# Patient Record
Sex: Female | Born: 1980 | Race: Black or African American | Hispanic: No | Smoking: Never smoker
Health system: Southern US, Community
[De-identification: ages and names within clinical notes are randomized; demographics above are authoritative.]

## PROBLEM LIST (undated history)

## (undated) DIAGNOSIS — R0789 Other chest pain: Secondary | ICD-10-CM

## (undated) DIAGNOSIS — G4700 Insomnia, unspecified: Secondary | ICD-10-CM

## (undated) HISTORY — DX: Other chest pain: R07.89

## (undated) HISTORY — DX: Insomnia, unspecified: G47.00

---

## 2015-03-07 ENCOUNTER — Ambulatory Visit
Admission: RE | Admit: 2015-03-07 | Discharge: 2015-03-07 | Disposition: A | Discharge status: Home / Self Care | Payer: No Typology Code available for payment source | Source: Ambulatory Visit | Attending: Infectious Disease | Admitting: Infectious Disease

## 2015-03-07 ENCOUNTER — Other Ambulatory Visit: Payer: Self-pay | Admitting: Infectious Disease

## 2015-03-07 DIAGNOSIS — R7611 Nonspecific reaction to tuberculin skin test without active tuberculosis: Secondary | ICD-10-CM

## 2015-06-20 ENCOUNTER — Emergency Department (HOSPITAL_COMMUNITY)
Admission: EM | Admit: 2015-06-20 | Discharge: 2015-06-20 | Disposition: A | Discharge status: Home / Self Care | Payer: Managed Care, Other (non HMO) | Attending: Emergency Medicine | Admitting: Emergency Medicine

## 2015-06-20 ENCOUNTER — Encounter (HOSPITAL_COMMUNITY): Payer: Self-pay

## 2015-06-20 DIAGNOSIS — M546 Pain in thoracic spine: Secondary | ICD-10-CM

## 2015-06-20 DIAGNOSIS — S3992XA Unspecified injury of lower back, initial encounter: Secondary | ICD-10-CM | POA: Diagnosis present

## 2015-06-20 DIAGNOSIS — S29002A Unspecified injury of muscle and tendon of back wall of thorax, initial encounter: Secondary | ICD-10-CM | POA: Diagnosis not present

## 2015-06-20 DIAGNOSIS — Y9389 Activity, other specified: Secondary | ICD-10-CM | POA: Diagnosis not present

## 2015-06-20 DIAGNOSIS — M545 Low back pain: Secondary | ICD-10-CM

## 2015-06-20 DIAGNOSIS — Y92410 Unspecified street and highway as the place of occurrence of the external cause: Secondary | ICD-10-CM | POA: Insufficient documentation

## 2015-06-20 DIAGNOSIS — Y998 Other external cause status: Secondary | ICD-10-CM | POA: Diagnosis not present

## 2015-06-20 MED ORDER — CYCLOBENZAPRINE HCL 10 MG PO TABS: 10.0000 mg | ORAL_TABLET | Freq: Three times a day (TID) | ORAL | Status: DC | PRN

## 2015-06-20 MED ORDER — ACETAMINOPHEN 500 MG PO TABS
1000.0000 mg | ORAL_TABLET | Freq: Once | ORAL | Status: AC
  Administered 2015-06-20: 1000 mg via ORAL
  Filled 2015-06-20: qty 2

## 2015-06-20 MED ORDER — IBUPROFEN 400 MG PO TABS
600.0000 mg | ORAL_TABLET | Freq: Once | ORAL | Status: AC
  Administered 2015-06-20: 600 mg via ORAL
  Filled 2015-06-20: qty 1

## 2015-06-20 MED ORDER — IBUPROFEN 600 MG PO TABS: 600.0000 mg | ORAL_TABLET | Freq: Three times a day (TID) | ORAL | Status: DC | PRN

## 2015-06-20 NOTE — Discharge Instructions (Signed)
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
## 2016-12-04 ENCOUNTER — Encounter: Payer: Self-pay | Admitting: Internal Medicine

## 2016-12-04 ENCOUNTER — Ambulatory Visit (INDEPENDENT_AMBULATORY_CARE_PROVIDER_SITE_OTHER): Payer: Managed Care, Other (non HMO) | Admitting: Internal Medicine

## 2016-12-04 ENCOUNTER — Encounter (INDEPENDENT_AMBULATORY_CARE_PROVIDER_SITE_OTHER): Payer: Self-pay

## 2016-12-04 ENCOUNTER — Other Ambulatory Visit: Payer: Self-pay

## 2016-12-04 DIAGNOSIS — Z0189 Encounter for other specified special examinations: Secondary | ICD-10-CM

## 2016-12-04 DIAGNOSIS — R0789 Other chest pain: Secondary | ICD-10-CM

## 2016-12-04 DIAGNOSIS — G4700 Insomnia, unspecified: Secondary | ICD-10-CM | POA: Insufficient documentation

## 2016-12-04 DIAGNOSIS — Z0000 Encounter for general adult medical examination without abnormal findings: Secondary | ICD-10-CM

## 2016-12-04 DIAGNOSIS — Z833 Family history of diabetes mellitus: Secondary | ICD-10-CM | POA: Diagnosis not present

## 2016-12-04 HISTORY — DX: Insomnia, unspecified: G47.00

## 2016-12-04 HISTORY — DX: Other chest pain: R07.89

## 2016-12-04 MED ORDER — RAMELTEON 8 MG PO TABS: 8.0000 mg | ORAL_TABLET | Freq: Every evening | ORAL | 1 refills | Status: DC | PRN

## 2016-12-04 NOTE — Patient Instructions (Addendum)
[facility].  I have written a prescription for Ramelteon 8mg  to be taken 30 minutes before bed each day you sleep.  If you have any questions please contact out clinic.  We would like for you to stop at the front desk and establish care with a primary care physician for a return visit. At that time they can discuss if the Ramelteon has been successful or not and make any necessary changes.     Practice Good Sleep Hygiene Here are some suggestions Avoid napping during the day. It can disturb the normal pattern of sleep and wakefulness.  Avoid stimulants such as caffeine, nicotine, and alcohol too close to bedtime. While alcohol is well known to speed the onset of sleep, it disrupts sleep in the second half as the body begins to metabolize the alcohol, causing arousal.  Exercise can promote good sleep. Vigorous exercise should be taken in the morning or late afternoon. A relaxing exercise, like yoga, can be done before bed to help initiate a restful night's sleep. Food can be disruptive right before sleep. Stay away from large meals close to bedtime. Also dietary changes can cause sleep problems, if someone is struggling with a sleep problem, it's not a good time to start experimenting with spicy dishes. And, remember, chocolate has caffeine.  Ensure adequate exposure to natural light. This is particularly important for older people who may not venture outside as frequently as children and adults. Light exposure helps maintain a healthy sleep-wake cycle.  Establish a regular relaxing bedtime routine. Try to avoid emotionally upsetting conversations and activities before trying to go to sleep. Don't dwell on, or bring your problems to bed.  Associate your bed with sleep. It's not a good idea to use your bed to watch TV, listen to the radio, or read.  Make sure that the sleep environment is pleasant and relaxing. The bed should be comfortable, the room should not be  too hot or cold, or too bright.

## 2016-12-04 NOTE — Progress Notes (Addendum)
   CC: "I need help sleeping"  HPI:Ms.Rabia Marissa Nestlebidoye is a 36 y.o. female who presents today to establish care with a primary care physician.   Chest discomfort with laughing: She attested to bilateral upper chest pain while walking, worse with extreme laughing, not made worse with exercise, does not occur while running, does not radiate, no associated diaphoresis, no nausea or vomiting, no headache or visual changes, denied numbness or tingling in hands or arms, not made worse with deep breathing. Denied associated cough, URI, or illness. Pain is described as sharp, 2 seconds in length, rapidly dissipates without intervention.   Insomnia: Assessment: Works night shifts, sleeps during day 4-5 hours. Sleeps during the day and at night while off shift.   Plan: Ramelteon 8mg  QHS 30 minutes prior Return in 3-4 weeks for PCP visit and check on medication effectiveness.     No past medical history on file. Review of Systems:  ROS negative except as per HPI.  PMHx: -Insomnia- -Musculoskeletal chest pain-see HPI   Surgical Hx: -Caesarian section in 2005, 2007, 2012   Family Hx: -Son 6679yrs old-diabetes and HTN -Parents deceased, mother [phone], father [phone]  Social Hx: -Tobacco, denied -EtOH, Denied -Illicit drugs  -Lives at home Husband with three kids.  Physical Exam: Vitals:   12/04/16 1446  BP: 129/72  Pulse: 83  Temp: 98.5 F (36.9 C)  TempSrc: Oral  SpO2: 100%  Weight: 173 lb 3.2 oz (78.6 kg)  Height: 5\' 8"  (1.727 m)   Physical Exam  Constitutional: She is oriented to person, place, and time. No distress.  Eyes: Conjunctivae and EOM are normal.  Neck: Neck supple.  Cardiovascular: Normal rate and regular rhythm.  No murmur heard. Pulmonary/Chest: Effort normal and breath sounds normal. No respiratory distress.  Abdominal: Soft. Bowel sounds are normal. She exhibits no distension. There is no tenderness.  Musculoskeletal: She exhibits no edema or tenderness.    Neurological: She is alert and oriented to person, place, and time.  Skin: Skin is warm. Capillary refill takes less than 2 seconds. She is not diaphoretic.  Psychiatric: She has a normal mood and affect.  Vitals reviewed.   Assessment & Plan:   See Encounters Tab for problem based charting.  Patient seen with Dr. Cleda DaubE. Hoffman

## 2016-12-04 NOTE — Assessment & Plan Note (Signed)
Assessment: Patient had flu vaccine with her employer  Patient deferred pap smear until next visit.   Plan: Please assess if patient is amenable for a pap smear at her next visit.

## 2016-12-04 NOTE — Assessment & Plan Note (Signed)
Chest discomfort with laughing: She attested to bilateral upper chest pain while walking, worse with extreme laughing, not made worse with exercise, does not occur while running, does not radiate, no associated diaphoresis, no nausea or vomiting, no headache or visual changes, denied numbness or tingling in hands or arms, not made worse with deep breathing. Denied associated cough, URI, or illness. Pain is described as sharp, 2 seconds in length, rapidly dissipates without intervention.   Plan: Observe. If pain worsens or changes in nature she was noted to advise us of this.

## 2016-12-04 NOTE — Assessment & Plan Note (Signed)
Insomnia: Assessment: Works night shifts, sleeps during day 4-5 hours. Sleeps during the day and at night while off shift.   Plan: Ramelteon 8mg  QHS 30 minutes prior Return in 3-4 weeks for PCP visit and check on medication effectiveness.  Given information regarding proper sleep hygiene as well.

## 2016-12-05 NOTE — Progress Notes (Signed)
Internal Medicine Clinic Attending  I saw and evaluated the patient.  I personally confirmed the key portions of the history and exam documented by Dr. Harbrecht and I reviewed pertinent patient test results.  The assessment, diagnosis, and plan were formulated together and I agree with the documentation in the resident's note.  

## 2017-01-25 ENCOUNTER — Ambulatory Visit (INDEPENDENT_AMBULATORY_CARE_PROVIDER_SITE_OTHER): Payer: Managed Care, Other (non HMO) | Admitting: Internal Medicine

## 2017-01-25 ENCOUNTER — Ambulatory Visit (HOSPITAL_COMMUNITY)
Admission: RE | Admit: 2017-01-25 | Discharge: 2017-01-25 | Disposition: A | Discharge status: Home / Self Care | Payer: Managed Care, Other (non HMO) | Source: Ambulatory Visit | Attending: Student in an Organized Health Care Education/Training Program | Admitting: Student in an Organized Health Care Education/Training Program

## 2017-01-25 ENCOUNTER — Encounter: Payer: Self-pay | Admitting: Internal Medicine

## 2017-01-25 ENCOUNTER — Other Ambulatory Visit: Payer: Self-pay

## 2017-01-25 DIAGNOSIS — J069 Acute upper respiratory infection, unspecified: Secondary | ICD-10-CM | POA: Diagnosis present

## 2017-01-25 DIAGNOSIS — J40 Bronchitis, not specified as acute or chronic: Secondary | ICD-10-CM | POA: Diagnosis not present

## 2017-01-25 DIAGNOSIS — R05 Cough: Secondary | ICD-10-CM | POA: Diagnosis not present

## 2017-01-25 DIAGNOSIS — B9789 Other viral agents as the cause of diseases classified elsewhere: Principal | ICD-10-CM

## 2017-01-25 DIAGNOSIS — R7611 Nonspecific reaction to tuberculin skin test without active tuberculosis: Secondary | ICD-10-CM

## 2017-01-25 HISTORY — DX: Acute upper respiratory infection, unspecified: J06.9

## 2017-01-25 NOTE — Assessment & Plan Note (Signed)
Teresa Barnett reports 1 week history of sore throat, cough productive of clear sputum, subjective fevers (normal temps at home), hoarseness of voice (now improving), and malaise. Symptoms began gradually and are somewhat improving. She denies any myalgias, diaphoresis, hemoptysis, shortness of breath, chest pain, congestion, rhinorrhea, or reflux.   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 She had a CXR on 03/07/2015 performed for "Positive TB skin test 1 week ago" which was negative for suspicious lesions. She says she has no known exposures to active TB and has not been treated for latent TB.   A/P: Her symptoms are consistent with viral URI which is slowly improving. 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 I have personally reviewed the CXR films and lungs are clear without evidence of consolidation, effusion, or suspicious lesions; official radiology report pending. I am not concerned about active TB at this time. Can consider treatment for latent TB if patient agreeable. - Continue supportive care

## 2017-01-25 NOTE — Patient Instructions (Addendum)
It was a pleasure to see you Teresa Barnett.  It sounds like you have a viral upper respiratory infection.  Please continue Tylenol or Ibuprofen as needed. You can try over the counter cough medication as needed. Try cough drops, hot tea with honey, ice cubes/popsicles to soothe the throat.  We will check baseline blood work and obtain a CXR as well.  Please schedule a follow up appointment as we discussed or see us sooner if needed.   Viral Respiratory Infection A respiratory infection is an illness that affects part of the respiratory system, such as the lungs, nose, or throat. Most respiratory infections are caused by either viruses or bacteria. A respiratory infection that is caused by a virus is called a viral respiratory infection. Common types of viral respiratory infections include:  A cold.  The flu (influenza).  A respiratory syncytial virus (RSV) infection.  How do I know if I have a viral respiratory infection? Most viral respiratory infections cause:  A stuffy or runny nose.  Yellow or green nasal discharge.  A cough.  Sneezing.  Fatigue.  Achy muscles.  A sore throat.  Sweating or chills.  A fever.  A headache.  How are viral respiratory infections treated? If influenza is diagnosed early, it may be treated with an antiviral medicine that shortens the length of time a person has symptoms. Symptoms of viral respiratory infections may be treated with over-the-counter and prescription medicines, such as:  Expectorants. These make it easier to cough up mucus.  Decongestant nasal sprays.  Health care providers do not prescribe antibiotic medicines for viral infections. This is because antibiotics are designed to kill bacteria. They have no effect on viruses. How do I know if I should stay home from work or school? To avoid exposing others to your respiratory infection, stay home if you have:  A fever.  A persistent cough.  A sore throat.  A runny  nose.  Sneezing.  Muscles aches.  Headaches.  Fatigue.  Weakness.  Chills.  Sweating.  Nausea.  Follow these instructions at home:  Rest as much as possible.  Take over-the-counter and prescription medicines only as told by your health care provider.  Drink enough fluid to keep your urine clear or pale yellow. This helps prevent dehydration and helps loosen up mucus.  Gargle with a salt-water mixture 3-4 times per day or as needed. To make a salt-water mixture, completely dissolve -1 tsp of salt in 1 cup of warm water.  Use nose drops made from salt water to ease congestion and soften raw skin around your nose.  Do not drink alcohol.  Do not use tobacco products, including cigarettes, chewing tobacco, and e-cigarettes. If you need help quitting, ask your health care provider. Contact a health care provider if:  Your symptoms last for 10 days or longer.  Your symptoms get worse over time.  You have a fever.  You have severe sinus pain in your face or forehead.  The glands in your jaw or neck become very swollen. Get help right away if:  You feel pain or pressure in your chest.  You have shortness of breath.  You faint or feel like you will faint.  You have severe and persistent vomiting.  You feel confused or disoriented. This information is not intended to replace advice given to you by your health care provider. Make sure you discuss any questions you have with your health care provider. Document Released: 10/11/2004 Document Revised: 06/09/2015 Document Reviewed: 06/09/2014  Elsevier Interactive Patient Education  2018 [employer]  

## 2017-01-25 NOTE — Progress Notes (Signed)
Internal Medicine Clinic Attending  Case discussed with Dr. Patel at the time of the visit.  We reviewed the resident's history and exam and pertinent patient test results.  I agree with the assessment, diagnosis, and plan of care documented in the resident's note.  

## 2017-01-25 NOTE — Progress Notes (Signed)
   CC: cough, sore throat  HPI:  TeresaTeresa Barnett is a 37 y.o. female who presents for evaluation of cough and sore throat.  Viral URI with cough Teresa Barnett reports 1 week history of sore throat, cough productive of clear sputum, subjective fevers (normal temps at home), hoarseness of voice (now improving), and malaise. Symptoms began gradually and are somewhat improving. She denies any myalgias, diaphoresis, hemoptysis, shortness of breath, chest pain, congestion, rhinorrhea, or reflux.   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 She had a CXR on 03/07/2015 performed for "Positive TB skin test 1 week ago" which was negative for suspicious lesions. She says she has no known exposures to active TB and has not been treated for latent TB.   A/P: Her symptoms are consistent with viral URI which is slowly improving. 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 I have personally reviewed the CXR films and lungs are clear without evidence of consolidation, effusion, or suspicious lesions; official radiology report pending. I am not concerned about active TB at this time. Can consider treatment for latent TB if patient agreeable. - Continue supportive care    Past Medical History:  Diagnosis Date  . Discomfort of chest wall 12/04/2016  . 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 Negative for diaphoresis.       Subjective fevers, normal temps at home  HENT: Positive for sore throat. Negative for congestion.   Respiratory: Positive for cough. Negative for hemoptysis and shortness of breath.        Clear sputum production  Cardiovascular: Negative for chest pain.  Gastrointestinal: Negative for abdominal pain, heartburn, nausea and  vomiting.  Musculoskeletal: Negative for myalgias.  Skin: Negative for rash.     Physical Exam:  Vitals:   01/25/17 0954  BP: 125/76  Pulse: 90  Temp: 98.3 F (36.8 C)  TempSrc: Oral  SpO2: 100%  Weight: 171 lb 9.6 oz (77.8 kg)  Height: 5\' 8"  (1.727 m)   Physical Exam  Constitutional: She is oriented to person, place, and time. She appears well-developed and well-nourished.  Non-toxic appearance. She does not appear ill. No distress.  HENT:  Head: Normocephalic and atraumatic.  Mouth/Throat: Oropharynx is clear and moist. No oropharyngeal exudate.  Cardiovascular: Normal rate and regular rhythm.  Pulmonary/Chest: Effort normal. No respiratory distress. She has no wheezes. She has no rhonchi. She has no rales.  Lymphadenopathy:    She has no cervical adenopathy.  Neurological: She is alert and oriented to person, place, and time.  Skin: Skin is warm. No rash noted.    Assessment & Plan:   See Encounters Tab for problem based charting.  Patient discussed with Dr. Oswaldo DoneVincent

## 2017-01-26 LAB — CBC WITH DIFFERENTIAL/PLATELET
BASOS: 0 %
Basophils Absolute: 0 10*3/uL (ref 0.0–0.2)
EOS (ABSOLUTE): 0.1 10*3/uL (ref 0.0–0.4)
EOS: 1 %
HEMATOCRIT: 34.4 % (ref 34.0–46.6)
Hemoglobin: 10.8 g/dL — ABNORMAL LOW (ref 11.1–15.9)
Immature Grans (Abs): 0 10*3/uL (ref 0.0–0.1)
Immature Granulocytes: 0 %
LYMPHS ABS: 1.7 10*3/uL (ref 0.7–3.1)
Lymphs: 22 %
MCH: 24.9 pg — AB (ref 26.6–33.0)
MCHC: 31.4 g/dL — ABNORMAL LOW (ref 31.5–35.7)
MCV: 79 fL (ref 79–97)
MONOCYTES: 8 %
Monocytes Absolute: 0.6 10*3/uL (ref 0.1–0.9)
NEUTROS ABS: 5.4 10*3/uL (ref 1.4–7.0)
Neutrophils: 69 %
Platelets: 215 10*3/uL (ref 150–379)
RBC: 4.34 x10E6/uL (ref 3.77–5.28)
RDW: 15.3 % (ref 12.3–15.4)
WBC: 7.8 10*3/uL (ref 3.4–10.8)

## 2017-01-26 LAB — BMP8+ANION GAP
Anion Gap: 16 mmol/L (ref 10.0–18.0)
BUN / CREAT RATIO: 15 (ref 9–23)
BUN: 13 mg/dL (ref 6–20)
CO2: 24 mmol/L (ref 20–29)
CREATININE: 0.86 mg/dL (ref 0.57–1.00)
Calcium: 9.7 mg/dL (ref 8.7–10.2)
Chloride: 103 mmol/L (ref 96–106)
GFR calc non Af Amer: 87 mL/min/{1.73_m2} (ref >59)
GFR, EST AFRICAN AMERICAN: 100 mL/min/{1.73_m2} (ref >59)
Glucose: 80 mg/dL (ref 65–99)
Potassium: 4.3 mmol/L (ref 3.5–5.2)
SODIUM: 143 mmol/L (ref 134–144)

## 2017-02-26 ENCOUNTER — Encounter: Payer: Self-pay | Admitting: General Practice

## 2017-02-26 ENCOUNTER — Ambulatory Visit: Payer: Managed Care, Other (non HMO)

## 2017-09-05 ENCOUNTER — Ambulatory Visit (INDEPENDENT_AMBULATORY_CARE_PROVIDER_SITE_OTHER): Payer: No Typology Code available for payment source | Admitting: Internal Medicine

## 2017-09-05 ENCOUNTER — Encounter: Payer: Self-pay | Admitting: Internal Medicine

## 2017-09-05 ENCOUNTER — Other Ambulatory Visit: Payer: Self-pay

## 2017-09-05 VITALS — BP 130/75 | HR 89 | Temp 99.3°F | Ht 68.0 in | Wt 169.0 lb

## 2017-09-05 DIAGNOSIS — R1031 Right lower quadrant pain: Secondary | ICD-10-CM

## 2017-09-05 DIAGNOSIS — N6312 Unspecified lump in the right breast, upper inner quadrant: Secondary | ICD-10-CM | POA: Diagnosis not present

## 2017-09-05 DIAGNOSIS — R109 Unspecified abdominal pain: Secondary | ICD-10-CM

## 2017-09-05 HISTORY — DX: Unspecified abdominal pain: R10.9

## 2017-09-05 HISTORY — DX: Unspecified lump in the right breast, upper inner quadrant: N63.12

## 2017-09-05 LAB — POCT URINALYSIS DIPSTICK
Bilirubin, UA: NEGATIVE
Blood, UA: NEGATIVE
Glucose, UA: NEGATIVE
Ketones, UA: NEGATIVE
Leukocytes, UA: NEGATIVE
Nitrite, UA: NEGATIVE
Protein, UA: NEGATIVE
Spec Grav, UA: 1.02 (ref 1.010–1.025)
Urobilinogen, UA: 0.2 E.U./dL
pH, UA: 6 (ref 5.0–8.0)

## 2017-09-05 LAB — POCT URINE PREGNANCY: Preg Test, Ur: NEGATIVE

## 2017-09-05 NOTE — Progress Notes (Signed)
CC: Right breast lump, RLQ pain  HPI:  Teresa Barnett is a 37 y.o. woman with no significant past medical history presenting for evaluation of right breast lump and right lower quadrant pain.  See problem based assessment below for further details.  Right breast lump: Teresa Barnett presents with a lump of her right breast with onset 2 months ago.  With regular palpation, she has noticed that the lump has been growing in size but unable to qualify the size.  Denies any breast discharge, skin changes, breast pain, family history of breast, ovarian, GI malignancies.  Also indicates that the lump is not associated with her menses but endorses tenderness with palpation.  She has regular menses with LMP 1 month ago.  Menarche at age 70 and currently has 3 children.  On physical exam, there were no noticeable breast skin changes bilaterally, there was a palpable mass of the right breast located at 2 o'clock measuring 2 to 3 cm that was tender to palpation.  Left breast exam was unremarkable.  Differential diagnosis for this young healthy woman with no significant family history of malignancy presenting with right breast lump include fibroadenoma vs fibrocystic changes of breast.  Ideally, I will only obtain an ultrasound if she was less than 24 years old. However given the range of her age, I will order both an ultrasound and mammogram. -Follow-up ultrasound and mammogram -Further management pending imaging studies  Right sided abdominal pain: Teresa Barnett reports of a 65-month history of right sided abdominal pain more toward the right lower quadrant that is only present when she is walking.  She works as a Engineer, civil (consulting) and requires increase ambulation.  Rates the pain as 4/10, sharp in nature and subsides with rest.  She denies fevers, chills, anorexia, nausea, vomiting.  She is sexually active and not on contraceptive.  LMP was 1 month ago and does not report irregular vaginal bleeding or unusual vaginal  discharge.  The pain is not related to diet.  She has bowel movements every 3 days and states that this is regular for her.  Abdominal exam was completely benign but noticeable pelvic scar from 3 previous C-sections.  Point-of-care urine pregnancy test was negative, point-of-care urine dipstick was unremarkable.  Differential diagnosis for this young healthy woman presented with right lower quadrant pain with exertion include constipation vs endometriosis vs MSK pain vs ovarian cyst vs referred pain from hepatic steatosis.  If pain persist, consideration could be given obtaining transabdominal ultrasound to evaluate for ovarian cyst.  -Continue to monitor. -Follow-up LFT  Past Medical History:  Diagnosis Date  . Discomfort of chest wall 12/04/2016  . 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  HENT:  Head: Normocephalic and atraumatic.  Cardiovascular: Normal rate, regular rhythm and normal heart sounds. Exam reveals no gallop and no friction rub.  No murmur heard. Pulmonary/Chest: Breath sounds normal. No respiratory distress. She has no wheezes. Right breast exhibits tenderness. Right breast exhibits no inverted nipple, no mass, no nipple discharge and no skin change. Left breast exhibits no inverted nipple, no mass, no nipple discharge, no skin change and no tenderness. Breasts  are symmetrical.    Abdominal: Soft. Bowel sounds are normal. She exhibits no distension. There is no tenderness.    Assessment & Plan:   See Encounters Tab for problem based charting.  Patient discussed with Dr. Sandre Kittyaines

## 2017-09-05 NOTE — Patient Instructions (Addendum)
Ms. Teresa Barnett,   It was a pleasure taking care of you at the clinic today.  I am ordering a few labs including pregnancy test, liver test, mammogram and ultrasound.  I will contact you with the results of these labs and give you follow-up recommendations.  ~Dr. Dortha SchwalbeAgyei

## 2017-09-05 NOTE — Assessment & Plan Note (Signed)
Right sided abdominal pain: Ms. Teresa Barnett reports of a 7141-month history of right sided abdominal pain more toward the right lower quadrant that is only present when she is walking.  She works as a Engineer, civil (consulting)nurse and requires increase ambulation.  Rates the pain as 4/10, sharp in nature and subsides with rest.  She denies fevers, chills, anorexia, nausea, vomiting.  She is sexually active and not on contraceptive.  LMP was 1 month ago and does not report irregular vaginal bleeding or unusual vaginal discharge.  The pain is not related to diet.  She has bowel movements every 3 days and states that this is regular for her.  Abdominal exam was completely benign but noticeable pelvic scar from 3 previous C-sections.  Point-of-care urine pregnancy test was negative, point-of-care urine dipstick was unremarkable.  Differential diagnosis for this young healthy woman presented with right lower quadrant pain with exertion include constipation vs endometriosis vs MSK pain vs ovarian cyst vs referred pain from hepatic steatosis.  If pain persist, consideration could be given obtaining transabdominal ultrasound to evaluate for ovarian cyst.  -Continue to monitor. -Follow-up LFT

## 2017-09-05 NOTE — Assessment & Plan Note (Signed)
Right breast lump: Teresa Barnett presents with a lump of her right breast with onset 2 months ago.  With regular palpation, she has noticed that the lump has been growing in size but unable to qualify the size.  Denies any breast discharge, skin changes, breast pain, family history of breast, ovarian, GI malignancies.  Also indicates that the lump is not associated with her menses but endorses tenderness with palpation.  She has regular menses with LMP 1 month ago.  Menarche at age 37 and currently has 3 children.  On physical exam, there were no noticeable breast skin changes bilaterally, there was a palpable mass of the right breast located at 2 o'clock measuring 2 to 3 cm that was tender to palpation.  Left breast exam was unremarkable.  Differential diagnosis for this young healthy woman with no significant family history of malignancy presenting with right breast lump include fibroadenoma vs fibrocystic changes of breast.  Ideally, I will only obtain an ultrasound if she was less than 993 years old. However given the range of her age, I will order both an ultrasound and mammogram. -Follow-up ultrasound and mammogram -Further management pending imaging studies

## 2017-09-06 ENCOUNTER — Telehealth: Payer: Self-pay | Admitting: Internal Medicine

## 2017-09-06 LAB — URINALYSIS, COMPLETE
Bilirubin, UA: NEGATIVE
Glucose, UA: NEGATIVE
Ketones, UA: NEGATIVE
Leukocytes, UA: NEGATIVE
Nitrite, UA: NEGATIVE
Protein, UA: NEGATIVE
RBC, UA: NEGATIVE
Specific Gravity, UA: 1.018 (ref 1.005–1.030)
Urobilinogen, Ur: 0.2 mg/dL (ref 0.2–1.0)
pH, UA: 5.5 (ref 5.0–7.5)

## 2017-09-06 LAB — HEPATIC FUNCTION PANEL
ALT: 13 IU/L (ref 0–32)
AST: 17 IU/L (ref 0–40)
Albumin: 4.3 g/dL (ref 3.5–5.5)
Alkaline Phosphatase: 45 IU/L (ref 39–117)
Bilirubin Total: 0.4 mg/dL (ref 0.0–1.2)
Bilirubin, Direct: 0.13 mg/dL (ref 0.00–0.40)
Total Protein: 7.3 g/dL (ref 6.0–8.5)

## 2017-09-06 LAB — MICROSCOPIC EXAMINATION: Casts: NONE SEEN /lpf

## 2017-09-06 NOTE — Telephone Encounter (Signed)
I called patient to discuss her lab results with her.  She had negative LFT, urinalysis and pregnancy test.  She was glad to hear about the normal findings.

## 2017-09-06 NOTE — Progress Notes (Signed)
Internal Medicine Clinic Attending  I saw and evaluated the patient.  I personally confirmed the key portions of the history and exam documented by Dr. Agyei and I reviewed pertinent patient test results.  The assessment, diagnosis, and plan were formulated together and I agree with the documentation in the resident's note.  Alexander Raines, M.D., Ph.D.  

## 2017-09-10 ENCOUNTER — Other Ambulatory Visit: Payer: No Typology Code available for payment source

## 2017-09-11 ENCOUNTER — Ambulatory Visit
Admission: RE | Admit: 2017-09-11 | Discharge: 2017-09-11 | Disposition: A | Discharge status: Home / Self Care | Payer: No Typology Code available for payment source | Source: Ambulatory Visit | Attending: Internal Medicine | Admitting: Internal Medicine

## 2017-09-11 DIAGNOSIS — N6312 Unspecified lump in the right breast, upper inner quadrant: Secondary | ICD-10-CM

## 2017-09-12 ENCOUNTER — Encounter: Payer: Self-pay | Admitting: Internal Medicine

## 2017-09-12 NOTE — Progress Notes (Signed)
I have sent a letter to the patient regarding her mammogram and ultrasound as I have been unable to reach her.  Overall, she had a negative mammogram and ultrasound and per radiology she is recommended for screening mammogram at age 37.

## 2018-01-29 ENCOUNTER — Encounter: Payer: Self-pay | Admitting: Internal Medicine

## 2018-04-28 ENCOUNTER — Ambulatory Visit (INDEPENDENT_AMBULATORY_CARE_PROVIDER_SITE_OTHER): Payer: No Typology Code available for payment source | Admitting: Internal Medicine

## 2018-04-28 ENCOUNTER — Other Ambulatory Visit: Payer: Self-pay

## 2018-04-28 DIAGNOSIS — G43109 Migraine with aura, not intractable, without status migrainosus: Secondary | ICD-10-CM | POA: Diagnosis not present

## 2018-04-28 MED ORDER — SUMATRIPTAN SUCCINATE 50 MG PO TABS: 50.0000 mg | ORAL_TABLET | ORAL | 0 refills | Status: DC | PRN

## 2018-04-28 NOTE — Progress Notes (Signed)
   [facility] Internal Medicine Residency Telephone Encounter  Reason for call:   This telephone encounter was created for Teresa Barnett on 04/28/2018 for the following purpose/cc migraine.   Pertinent Data:   History of insomnia otherwise no real PMH.  No high blood pressure. Not on any meds.   ROS: Pulmonary: pt denies increased work of breathing, shortness of breath,  Cardiac: pt denies palpitations, chest pain,   Abdominal: pt denies abdominal pain, nausea, vomiting, or diarrhea   Assessment / Plan / Recommendations:   A: Migraine: started on Tuesday of last week then went away Friday.  Headache is left sided. Had an aura and photofobia. Not on birth control.  No changes in schedule works nights.  No meningismus.  Gradual onset with aura preceding.  No neurological deficits normal strength and sensation, coordination.  No change in smile, no head injuries. Hasn't had headaches like this since about 4 months ago.  Gets them periodically but usually they respond well to tylenol.  Tylenol helped a little this time but headache persisted. No alarm symptoms, headaches classic for migraine.       P: prescribe sumatriptan for abortive therapy, she will also try naproxen instead of tylenol.    As always, pt is advised that if symptoms worsen or new symptoms arise, they should go to an urgent care facility or to to ER for further evaluation.   Consent and Medical Decision Making:   Patient discussed with Dr. Cyndie Chime  This is a telephone encounter between Carnelia Ademidun Febles and Thornell Mule on 04/28/2018 for . The visit was conducted with the patient located at home and Thornell Mule at [facility]. The patient's identity was confirmed using their DOB and current address. The patient has consented to being evaluated through a telephone encounter and understands the associated risks (an examination cannot be done and the patient may need to come in for an appointment) /  benefits (allows the patient to remain at home, decreasing exposure to coronavirus). I personally spent 18 minutes on medical discussion.

## 2018-04-28 NOTE — Progress Notes (Signed)
Medicine attending: Medical history, presenting problems,  and medications, reviewed with resident physician Dr Chana Bode on the day of the patient telephone conversation and I concur with his evaluation and management plan. Neurology RN c/o signs & sxs typical of migraine occurring about every 4 months. I concur w trial of sumatriptan at onset of aura & NSAIDs to otherwise control sxs.

## 2018-04-28 NOTE — Assessment & Plan Note (Signed)
   A: Migraine: started on Tuesday of last week then went away Friday.  Headache is left sided. Had an aura and photofobia. Not on birth control.  No changes in schedule works nights.  No meningismus.  Gradual onset with aura preceding.  No neurological deficits normal strength and sensation, coordination.  No change in smile, no head injuries. Hasn't had headaches like this since about 4 months ago.  Gets them periodically but usually they respond well to tylenol.  Tylenol helped a little this time but headache persisted. No alarm symptoms, headaches classic for migraine.       P: prescribe sumatriptan for abortive therapy, she will also try naproxen instead of tylenol.    As always, pt is advised that if symptoms worsen or new symptoms arise, they should go to an urgent care facility or to to ER for further evaluation.

## 2018-12-03 ENCOUNTER — Other Ambulatory Visit: Payer: Self-pay

## 2018-12-03 ENCOUNTER — Encounter: Payer: Self-pay | Admitting: Internal Medicine

## 2018-12-03 ENCOUNTER — Ambulatory Visit (INDEPENDENT_AMBULATORY_CARE_PROVIDER_SITE_OTHER): Payer: No Typology Code available for payment source | Admitting: Internal Medicine

## 2018-12-03 VITALS — BP 117/75 | HR 108 | Temp 98.8°F | Ht 68.0 in | Wt 173.3 lb

## 2018-12-03 DIAGNOSIS — Z124 Encounter for screening for malignant neoplasm of cervix: Secondary | ICD-10-CM

## 2018-12-03 DIAGNOSIS — Z0000 Encounter for general adult medical examination without abnormal findings: Secondary | ICD-10-CM

## 2018-12-03 DIAGNOSIS — Z114 Encounter for screening for human immunodeficiency virus [HIV]: Secondary | ICD-10-CM | POA: Diagnosis not present

## 2018-12-03 DIAGNOSIS — G8911 Acute pain due to trauma: Secondary | ICD-10-CM | POA: Diagnosis not present

## 2018-12-03 DIAGNOSIS — M542 Cervicalgia: Secondary | ICD-10-CM | POA: Diagnosis not present

## 2018-12-03 DIAGNOSIS — R000 Tachycardia, unspecified: Secondary | ICD-10-CM | POA: Insufficient documentation

## 2018-12-03 DIAGNOSIS — M7918 Myalgia, other site: Secondary | ICD-10-CM | POA: Diagnosis not present

## 2018-12-03 DIAGNOSIS — G43109 Migraine with aura, not intractable, without status migrainosus: Secondary | ICD-10-CM | POA: Diagnosis not present

## 2018-12-03 NOTE — Assessment & Plan Note (Signed)
Need for cervical cancer screening: Patient has elected to schedule her cervical cancer screening for December some time. She did not wish to have this performed today.  I advised her that she had this obtained by OB/GYN or through our office if she prefers.

## 2018-12-03 NOTE — Assessment & Plan Note (Signed)
Tachycardia: Appears sinus. Confirmed with auscultation. No MRG's. Patient appears comfortable at rest and lacking overt anxiety. Given her prior anemia of 10.8 in January 2019 I will order screening labs to better evaluate her tachycardia but given that she is asymptomatic we will await these results prior to pursuing additional workup.  Plan:  TSH BMP CBC w/ diff
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
## 2018-12-03 NOTE — Progress Notes (Signed)
Internal Medicine Clinic Attending  Case discussed with Dr. Harbrecht at the time of the visit.  We reviewed the resident's history and exam and pertinent patient test results.  I agree with the assessment, diagnosis, and plan of care documented in the resident's note.   

## 2018-12-03 NOTE — Assessment & Plan Note (Signed)
Need for HIV screening: Patient agreed to testing today for her routine HIV screening.

## 2018-12-03 NOTE — Assessment & Plan Note (Signed)
Migraines: Better with aleve and rest. Very intermittent. Less than 1-2 per month. Continue to monitor for now and use NSAIDS/rest as needed.

## 2018-12-03 NOTE — Assessment & Plan Note (Signed)
  Need for Influenza vaccination: Patient has received her influenza vaccination at work. She is a Adult nurse.

## 2018-12-03 NOTE — Patient Instructions (Addendum)
Please stop at the front desk and schedule a pap smear only visit in December.   FOLLOW-UP INSTRUCTIONS When: A year after completion of the Pap smear For: Routine visit What to bring: All of your medications  Today we discussed your need for routine screening of renal function, electrolytes, hemoglobin, and routine HIV screening. Additionally, given you regular heart rate at an increased rate we will obtain a TSH. I will notify you of the results of any labs from today's evaluation when available to me and make them available on MyChart.   Thank you for your visit to the [facility] today. If you have any questions or concerns please call us at [phone].

## 2018-12-04 LAB — TSH: TSH: 0.748 u[IU]/mL (ref 0.450–4.500)

## 2018-12-04 LAB — CBC WITH DIFFERENTIAL/PLATELET
Basophils Absolute: 0 10*3/uL (ref 0.0–0.2)
Basos: 1 %
EOS (ABSOLUTE): 0.1 10*3/uL (ref 0.0–0.4)
Eos: 1 %
Hematocrit: 34.9 % (ref 34.0–46.6)
Hemoglobin: 10.9 g/dL — ABNORMAL LOW (ref 11.1–15.9)
Immature Grans (Abs): 0 10*3/uL (ref 0.0–0.1)
Immature Granulocytes: 0 %
Lymphocytes Absolute: 1.6 10*3/uL (ref 0.7–3.1)
Lymphs: 26 %
MCH: 24.9 pg — ABNORMAL LOW (ref 26.6–33.0)
MCHC: 31.2 g/dL — ABNORMAL LOW (ref 31.5–35.7)
MCV: 80 fL (ref 79–97)
Monocytes Absolute: 0.4 10*3/uL (ref 0.1–0.9)
Monocytes: 6 %
Neutrophils Absolute: 4.1 10*3/uL (ref 1.4–7.0)
Neutrophils: 66 %
Platelets: 188 10*3/uL (ref 150–450)
RBC: 4.38 x10E6/uL (ref 3.77–5.28)
RDW: 13.9 % (ref 11.7–15.4)
WBC: 6.1 10*3/uL (ref 3.4–10.8)

## 2018-12-04 LAB — BMP8+ANION GAP
Anion Gap: 16 mmol/L (ref 10.0–18.0)
BUN/Creatinine Ratio: 14 (ref 9–23)
BUN: 12 mg/dL (ref 6–20)
CO2: 22 mmol/L (ref 20–29)
Calcium: 9.6 mg/dL (ref 8.7–10.2)
Chloride: 103 mmol/L (ref 96–106)
Creatinine, Ser: 0.83 mg/dL (ref 0.57–1.00)
GFR calc Af Amer: 103 mL/min/{1.73_m2} (ref >59)
GFR calc non Af Amer: 89 mL/min/{1.73_m2} (ref >59)
Glucose: 93 mg/dL (ref 65–99)
Potassium: 4 mmol/L (ref 3.5–5.2)
Sodium: 141 mmol/L (ref 134–144)

## 2018-12-04 LAB — LIPID PANEL
Chol/HDL Ratio: 3.1 ratio (ref 0.0–4.4)
Cholesterol, Total: 147 mg/dL (ref 100–199)
HDL: 48 mg/dL (ref >39)
LDL Chol Calc (NIH): 81 mg/dL (ref 0–99)
Triglycerides: 94 mg/dL (ref 0–149)
VLDL Cholesterol Cal: 18 mg/dL (ref 5–40)

## 2018-12-04 LAB — HIV ANTIBODY (ROUTINE TESTING W REFLEX): HIV Screen 4th Generation wRfx: NONREACTIVE

## 2018-12-17 ENCOUNTER — Encounter: Payer: Self-pay | Admitting: Internal Medicine

## 2018-12-17 ENCOUNTER — Other Ambulatory Visit (HOSPITAL_COMMUNITY)
Admission: RE | Admit: 2018-12-17 | Discharge: 2018-12-17 | Disposition: A | Discharge status: Home / Self Care | Payer: No Typology Code available for payment source | Source: Ambulatory Visit | Attending: Internal Medicine | Admitting: Internal Medicine

## 2018-12-17 ENCOUNTER — Ambulatory Visit: Payer: No Typology Code available for payment source | Admitting: Internal Medicine

## 2018-12-17 VITALS — BP 125/77 | HR 78 | Temp 98.9°F | Ht 68.0 in | Wt 171.9 lb

## 2018-12-17 DIAGNOSIS — Z124 Encounter for screening for malignant neoplasm of cervix: Secondary | ICD-10-CM | POA: Insufficient documentation

## 2018-12-17 NOTE — Progress Notes (Signed)
PAP smear performed today. Chaperoned by Sander Nephew. The cervical os was visualized without comlication on the first attempt. It was closed, there was not discharge or erythema noted. Results will be called to the patient.

## 2018-12-22 LAB — CYTOLOGY - PAP
Comment: NEGATIVE
Diagnosis: NEGATIVE
High risk HPV: NEGATIVE

## 2019-04-21 ENCOUNTER — Encounter: Payer: Self-pay | Admitting: Internal Medicine

## 2019-04-21 ENCOUNTER — Ambulatory Visit (INDEPENDENT_AMBULATORY_CARE_PROVIDER_SITE_OTHER): Payer: No Typology Code available for payment source | Admitting: Internal Medicine

## 2019-04-21 VITALS — BP 129/89 | HR 112 | Temp 98.2°F | Ht 68.0 in | Wt 168.3 lb

## 2019-04-21 DIAGNOSIS — M79605 Pain in left leg: Secondary | ICD-10-CM | POA: Diagnosis not present

## 2019-04-21 DIAGNOSIS — D500 Iron deficiency anemia secondary to blood loss (chronic): Secondary | ICD-10-CM | POA: Diagnosis not present

## 2019-04-21 MED ORDER — DICLOFENAC SODIUM 1 % EX GEL: 2.0000 g | Freq: Four times a day (QID) | CUTANEOUS | 1 refills | Status: AC | PRN

## 2019-04-21 MED ORDER — FERROUS GLUCONATE 324 (38 FE) MG PO TABS: 324.0000 mg | ORAL_TABLET | Freq: Every day | ORAL | 1 refills | Status: DC
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
## 2019-04-21 NOTE — Patient Instructions (Signed)
Teresa Barnett I have started you on oral iron and we will check your iron levels and blood count today.  I have prescribed an antiinflammatory cream for your leg as well.  I will call you with your results.
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
## 2019-04-21 NOTE — Assessment & Plan Note (Addendum)
Moderate menstrual cycle occurring for four days per month.  She is not a vegetarian, She gets very tired at work.  She reports feeling tired over the last few years and worse over the last few months.  Weight has been stable around 170lbs.  Took iron supplements when she lived in [place] but stopped when coming here to the [place].  No sexual activity in last year and LNMP middle of last month.  She is not interested in any type of birth control today.  We had a joint decision making discussion about IV iron vs oral iron and she would like to initiate oral iron therapy.    -iron studies and CBC -start empirically on ferrous gluconate -can check for response in around 6 weeks

## 2019-04-22 ENCOUNTER — Other Ambulatory Visit: Payer: Self-pay | Admitting: Internal Medicine

## 2019-04-22 DIAGNOSIS — G43109 Migraine with aura, not intractable, without status migrainosus: Secondary | ICD-10-CM

## 2019-04-22 LAB — IRON AND TIBC
Iron Saturation: 45 % (ref 15–55)
Iron: 144 ug/dL (ref 27–159)
Total Iron Binding Capacity: 318 ug/dL (ref 250–450)
UIBC: 174 ug/dL (ref 131–425)

## 2019-04-22 LAB — CBC WITH DIFFERENTIAL/PLATELET
Basophils Absolute: 0.1 10*3/uL (ref 0.0–0.2)
Basos: 1 %
EOS (ABSOLUTE): 0.1 10*3/uL (ref 0.0–0.4)
Eos: 1 %
Hematocrit: 37.2 % (ref 34.0–46.6)
Hemoglobin: 11.2 g/dL (ref 11.1–15.9)
Immature Grans (Abs): 0 10*3/uL (ref 0.0–0.1)
Immature Granulocytes: 0 %
Lymphocytes Absolute: 1.5 10*3/uL (ref 0.7–3.1)
Lymphs: 25 %
MCH: 24.5 pg — ABNORMAL LOW (ref 26.6–33.0)
MCHC: 30.1 g/dL — ABNORMAL LOW (ref 31.5–35.7)
MCV: 81 fL (ref 79–97)
Monocytes Absolute: 0.5 10*3/uL (ref 0.1–0.9)
Monocytes: 9 %
Neutrophils Absolute: 3.7 10*3/uL (ref 1.4–7.0)
Neutrophils: 64 %
Platelets: 204 10*3/uL (ref 150–450)
RBC: 4.58 x10E6/uL (ref 3.77–5.28)
RDW: 14.4 % (ref 11.7–15.4)
WBC: 5.8 10*3/uL (ref 3.4–10.8)

## 2019-04-22 LAB — FERRITIN: Ferritin: 45 ng/mL (ref 15–150)

## 2019-04-22 NOTE — Progress Notes (Signed)
Internal Medicine Clinic Attending  Case discussed with Dr. Winfrey  at the time of the visit.  We reviewed the resident's history and exam and pertinent patient test results.  I agree with the assessment, diagnosis, and plan of care documented in the resident's note.  

## 2019-04-23 NOTE — Progress Notes (Signed)
Spoke with pt about her iron studies and cbc results, we will continue oral iron and monitor response.

## 2019-05-18 ENCOUNTER — Encounter: Payer: Self-pay | Admitting: *Deleted

## 2019-08-01 ENCOUNTER — Other Ambulatory Visit: Payer: Self-pay | Admitting: Internal Medicine

## 2019-08-01 DIAGNOSIS — G43109 Migraine with aura, not intractable, without status migrainosus: Secondary | ICD-10-CM

## 2019-09-10 ENCOUNTER — Other Ambulatory Visit: Payer: Self-pay | Admitting: Internal Medicine

## 2019-09-10 DIAGNOSIS — Z1231 Encounter for screening mammogram for malignant neoplasm of breast: Secondary | ICD-10-CM

## 2019-09-28 ENCOUNTER — Encounter: Payer: Self-pay | Admitting: Student
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
## 2019-09-29 ENCOUNTER — Other Ambulatory Visit: Payer: Self-pay

## 2019-09-29 ENCOUNTER — Ambulatory Visit (INDEPENDENT_AMBULATORY_CARE_PROVIDER_SITE_OTHER): Payer: No Typology Code available for payment source | Admitting: Student

## 2019-09-29 ENCOUNTER — Other Ambulatory Visit (HOSPITAL_COMMUNITY): Payer: Self-pay | Admitting: Student

## 2019-09-29 ENCOUNTER — Encounter: Payer: Self-pay | Admitting: Student

## 2019-09-29 VITALS — BP 126/77 | HR 76 | Temp 98.3°F | Ht 68.0 in | Wt 174.3 lb

## 2019-09-29 DIAGNOSIS — G43109 Migraine with aura, not intractable, without status migrainosus: Secondary | ICD-10-CM

## 2019-09-29 DIAGNOSIS — G4700 Insomnia, unspecified: Secondary | ICD-10-CM

## 2019-09-29 DIAGNOSIS — R0789 Other chest pain: Secondary | ICD-10-CM

## 2019-09-29 DIAGNOSIS — D500 Iron deficiency anemia secondary to blood loss (chronic): Secondary | ICD-10-CM | POA: Diagnosis not present

## 2019-09-29 DIAGNOSIS — R079 Chest pain, unspecified: Secondary | ICD-10-CM | POA: Insufficient documentation

## 2019-09-29 DIAGNOSIS — Z124 Encounter for screening for malignant neoplasm of cervix: Secondary | ICD-10-CM

## 2019-09-29 DIAGNOSIS — Z0000 Encounter for general adult medical examination without abnormal findings: Secondary | ICD-10-CM | POA: Diagnosis not present

## 2019-09-29 DIAGNOSIS — Z114 Encounter for screening for human immunodeficiency virus [HIV]: Secondary | ICD-10-CM

## 2019-09-29 MED ORDER — SUMATRIPTAN SUCCINATE 50 MG PO TABS: ORAL_TABLET | ORAL | 1 refills | Status: DC

## 2019-09-29 MED ORDER — FERROUS GLUCONATE 324 (38 FE) MG PO TABS: 324.0000 mg | ORAL_TABLET | Freq: Every day | ORAL | 1 refills | Status: DC

## 2019-09-29 MED ORDER — RAMELTEON 8 MG PO TABS: 8.0000 mg | ORAL_TABLET | Freq: Every evening | ORAL | 5 refills | Status: DC | PRN

## 2019-09-29 NOTE — Assessment & Plan Note (Signed)
Patient reports a single episode of substernal chest pain which occurred 3 weeks ago. She states she had woken up from sleeping after a night shift and was lying in bed watching TV when suddenly she felt a dull pain in the middle of her chest. The pain did not radiate. There was no associated diaphoresis, abdominal pain, N/V, dyspnea, palpitations. She reports her diet consists mostly of African food which she reports is "heavy, full of carbs," and her diet has not changed lately. She reports the pain subsided after 3-5 minutes and has not recurred since. Reports a similar sensation about 2 years ago.   On exam, the pain is not reproducible with palpation.  Assessment/Plan: I am reassured that this pain resolved on its own, had no concerning associated symptoms such as dyspnea, and has not recurred. No indication for work-up at this time. Instructed patient to let us know if this recurs.

## 2019-09-29 NOTE — Progress Notes (Signed)
Internal Medicine Clinic Attending  I saw and evaluated the patient.  I personally confirmed the key portions of the history and exam documented by Dr. Watson and I reviewed pertinent patient test results.  The assessment, diagnosis, and plan were formulated together and I agree with the documentation in the resident's note.  Adeja Sarratt, M.D., Ph.D.  

## 2019-09-29 NOTE — Patient Instructions (Addendum)
Teresa Barnett,   Thank you for your visit to the [facility] Internal Medicine Clinic today. It was a pleasure seeing you. Today we discussed the following:  1) Iron deficiency anemia: We suspect this is most likely due to blood loss from your menstrual cycle.  - We are repeating labs to monitor your response to the oral iron supplementation. I will call you with the results of your CBC and iron studies. - Continue taking your iron pills. I refilled these today.  2) Chronic migraines: I'm glad to hear that you are having migraines so infrequently.  - Continue to avoid your known trigger, caffeine. - I refilled your sumatriptan today.  3) Blood pressure: your blood pressure was initially slightly elevated however came down to a normal level when we repeated it.   4) Flu shot: Let us know when you get your flu shot from your employer, and we can add that information into your chart.  5) Mammogram: Keep your appointment on 10/28/19 for your screening mammogram.   I will give you a call with the results of your lab work. How soon you follow-up will be determined by the lab results, but we will plan tentatively to see you back in ~6 months.   If you have any questions or concerns, please call our clinic at [phone] between 9am-5pm. Outside of these hours, call [phone] and ask for the internal medicine resident on call. If you feel you are having a medical emergency please call 911.

## 2019-09-29 NOTE — Assessment & Plan Note (Signed)
Pap and HPV co-testing last performed on 12/17/18 were negative. Patient will be due for repeating screening in 12/2023.

## 2019-09-29 NOTE — Assessment & Plan Note (Signed)
Patient reports she has averaged ~1 migraine headache every 1-2 months for the last few months. Uses her sumatriptan very sparingly. Reports she identified caffeine as a trigger, so she is avoiding coffee. When she is tired at work (she works a lot of night shifts), instead of having coffee she gets up and walks around and chats with people.  Plan: - Refilled sumatriptan - Avoid caffeine

## 2019-09-29 NOTE — Assessment & Plan Note (Addendum)
Patient presents today after being started on empiric ferrous gluconate therapy 5 months ago. She reports she is tolerating the PO iron supplementation well, denies constipation or GI upset. She does notice improved energy since initiation of therapy. Reviewed her menstrual activity. She reports her menstrual cycles last 3-4 days per month. She uses pads only, changes them 4x daily when she uses the restroom, reports they are not soaked through with blood. Has not noticed any increase in her flow. Denies blood in her stool or black, tarry stools. She has no known family history of colon cancer. She is not currently on birth control. Has previously not been interested in initiating birth control.  Assessment/Plan: Given her reported history of IDA responsive to oral iron, regular menstruation, suspect IDA is secondary to blood loss from menstruation. Will repeat blood work today to monitor for response to therapy started 5 months ago.  - Repeat CBC and iron studies today - Continue PO iron supplementation, ferrous gluconate 324 mg daily with breakfast  ADDENDUM #1:  Repeat labs demonstrate stable CBC and actually decreased iron despite 5 months of oral iron supplementation. This raises my suspicion for either poor absorption or ongoing blood loss. Will bring patient back for lab only visit to test for Celiac disease and H pylori. If these are negative, consider colonoscopy to screen for colon cancer. Patient has no known family history of colon cancer. - Discussed results with patient. She will come back for lab only appointment to test for Celiac disease and H pylori.   ADDENDUM #2 - Celiac disease panel negative except for mildly elevated serum IgA, which is non-specific, suggestive of inflammation - H pylori stool test pending

## 2019-09-29 NOTE — Assessment & Plan Note (Signed)
-   Patient states she will get her flu vaccine through work - Brought her COVID-19 immunization card today, and this information was entered into her chart - Pap and HPV co-testing last performed on 12/17/18 were negative. Patient will be due for repeating screening in 12/2023. - Patient is scheduled for screening mammogram on 10/28/19.

## 2019-09-29 NOTE — Assessment & Plan Note (Signed)
Patient reports she uses prescribed Ramelteon 8 mg QHS sparingly, however finds it effective.  Plan: - Refilled ramelteon 8 mg QHS

## 2019-09-30 LAB — CBC WITH DIFFERENTIAL/PLATELET
Basophils Absolute: 0 10*3/uL (ref 0.0–0.2)
Basos: 1 %
EOS (ABSOLUTE): 0 10*3/uL (ref 0.0–0.4)
Eos: 1 %
Hematocrit: 36.9 % (ref 34.0–46.6)
Hemoglobin: 11.1 g/dL (ref 11.1–15.9)
Immature Grans (Abs): 0 10*3/uL (ref 0.0–0.1)
Immature Granulocytes: 0 %
Lymphocytes Absolute: 1.4 10*3/uL (ref 0.7–3.1)
Lymphs: 25 %
MCH: 24.3 pg — ABNORMAL LOW (ref 26.6–33.0)
MCHC: 30.1 g/dL — ABNORMAL LOW (ref 31.5–35.7)
MCV: 81 fL (ref 79–97)
Monocytes Absolute: 0.4 10*3/uL (ref 0.1–0.9)
Monocytes: 7 %
Neutrophils Absolute: 3.8 10*3/uL (ref 1.4–7.0)
Neutrophils: 66 %
Platelets: 181 10*3/uL (ref 150–450)
RBC: 4.56 x10E6/uL (ref 3.77–5.28)
RDW: 14.7 % (ref 11.7–15.4)
WBC: 5.6 10*3/uL (ref 3.4–10.8)

## 2019-09-30 LAB — IRON AND TIBC
Iron Saturation: 9 % — CL (ref 15–55)
Iron: 28 ug/dL (ref 27–159)
Total Iron Binding Capacity: 314 ug/dL (ref 250–450)
UIBC: 286 ug/dL (ref 131–425)

## 2019-09-30 LAB — FERRITIN: Ferritin: 48 ng/mL (ref 15–150)

## 2019-10-02 NOTE — Addendum Note (Signed)
Addended by: Alphonzo Severance on: 10/02/2019 09:56 AM   Modules accepted: Orders

## 2019-10-06 ENCOUNTER — Other Ambulatory Visit (INDEPENDENT_AMBULATORY_CARE_PROVIDER_SITE_OTHER): Payer: No Typology Code available for payment source

## 2019-10-06 DIAGNOSIS — D500 Iron deficiency anemia secondary to blood loss (chronic): Secondary | ICD-10-CM | POA: Diagnosis not present

## 2019-10-08 LAB — CELIAC DISEASE PANEL
Endomysial IgA: NEGATIVE
IgA/Immunoglobulin A, Serum: 400 mg/dL — ABNORMAL HIGH (ref 87–352)
Transglutaminase IgA: <2 U/mL (ref 0–3)

## 2019-10-15 ENCOUNTER — Other Ambulatory Visit: Payer: Self-pay | Admitting: Internal Medicine

## 2019-10-15 DIAGNOSIS — G43109 Migraine with aura, not intractable, without status migrainosus: Secondary | ICD-10-CM

## 2019-10-15 NOTE — Telephone Encounter (Signed)
I prescribed 10 tablets with 1 refill for this migraine abortive therapy. If the patient is already requiring a refill, she will need to be evaluated in person.

## 2019-10-15 NOTE — Telephone Encounter (Signed)
Call to pharmacy-pt has sumatriptan rx  "ready for pickup".  System automatically sent refill request with last refill.  No additional refills needed at this time.Kingsley Spittle Cassady9/30/20214:04 PM

## 2019-10-28 ENCOUNTER — Other Ambulatory Visit: Payer: Self-pay

## 2019-10-28 ENCOUNTER — Ambulatory Visit
Admission: RE | Admit: 2019-10-28 | Discharge: 2019-10-28 | Disposition: A | Discharge status: Home / Self Care | Payer: No Typology Code available for payment source | Source: Ambulatory Visit | Attending: Internal Medicine | Admitting: Internal Medicine

## 2019-10-28 DIAGNOSIS — Z1231 Encounter for screening mammogram for malignant neoplasm of breast: Secondary | ICD-10-CM

## 2019-11-02 ENCOUNTER — Other Ambulatory Visit: Payer: Self-pay | Admitting: Internal Medicine

## 2019-11-02 DIAGNOSIS — R928 Other abnormal and inconclusive findings on diagnostic imaging of breast: Secondary | ICD-10-CM

## 2019-11-11 ENCOUNTER — Ambulatory Visit
Admission: RE | Admit: 2019-11-11 | Discharge: 2019-11-11 | Disposition: A | Discharge status: Home / Self Care | Payer: No Typology Code available for payment source | Source: Ambulatory Visit | Attending: Internal Medicine | Admitting: Internal Medicine

## 2019-11-11 ENCOUNTER — Other Ambulatory Visit: Payer: Self-pay

## 2019-11-11 DIAGNOSIS — R928 Other abnormal and inconclusive findings on diagnostic imaging of breast: Secondary | ICD-10-CM

## 2020-05-16 ENCOUNTER — Other Ambulatory Visit (HOSPITAL_COMMUNITY): Payer: Self-pay

## 2020-06-02 ENCOUNTER — Other Ambulatory Visit: Payer: Self-pay

## 2020-06-02 ENCOUNTER — Ambulatory Visit (INDEPENDENT_AMBULATORY_CARE_PROVIDER_SITE_OTHER): Payer: BC Managed Care – PPO | Admitting: Internal Medicine

## 2020-06-02 ENCOUNTER — Encounter: Payer: Self-pay | Admitting: Internal Medicine

## 2020-06-02 VITALS — BP 120/77 | HR 90 | Temp 98.7°F | Ht 68.0 in | Wt 163.0 lb

## 2020-06-02 DIAGNOSIS — B360 Pityriasis versicolor: Secondary | ICD-10-CM

## 2020-06-02 DIAGNOSIS — D500 Iron deficiency anemia secondary to blood loss (chronic): Secondary | ICD-10-CM

## 2020-06-02 DIAGNOSIS — Z131 Encounter for screening for diabetes mellitus: Secondary | ICD-10-CM

## 2020-06-02 MED ORDER — TERBINAFINE 1 % EX GEL: CUTANEOUS | 1 refills | Status: DC

## 2020-06-02 NOTE — Patient Instructions (Signed)
Thank you for trusting me with your care. To recap, today we discussed the following:   1. History of Iron deficiency anemia   - CBC with Diff - Iron, TIBC and Ferritin Panel - BMP8+Anion Gap  2. Tinea versicolor  - Terbinafine 1 % GEL; Apply to effected are twice daily for 14 days  Dispense: 12 g; Refill: 1  3. Screening for diabetes mellitus  - Hemoglobin A1c
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
## 2020-06-02 NOTE — Assessment & Plan Note (Signed)
History of IDA, thought to be secondary to heavy menstruation. Patient denies heavy periods, but reports IDA for a long time. Patient is not on iron therapy currently.   Assessment/Plan: Iron deficiency anemia , unknown etiology. Patient never completed H.Pylori testing, denies symptoms of dyspepsia. Will check iron studies today and further discuss testing with patient if needed.   - CBC with Diff - Iron, TIBC and Ferritin Panel - BMP8+Anion Gap

## 2020-06-02 NOTE — Assessment & Plan Note (Addendum)
  Screening for diabetes mellitus - Hemoglobin A1c 

## 2020-06-02 NOTE — Assessment & Plan Note (Signed)
Patient reports a pruritic rash on her back.  She thought she had eczema and after evaluation it appears to be tinea versicolor. Patient prescribed treatment as below.   Assessment/Plan: Tinea versicolor - Terbinafine 1 % GEL; Apply to effected are twice daily for 14 days  Dispense: 12 g; Refill: 1

## 2020-06-03 ENCOUNTER — Telehealth: Payer: Self-pay | Admitting: Internal Medicine

## 2020-06-03 ENCOUNTER — Encounter: Payer: Self-pay | Admitting: Internal Medicine

## 2020-06-03 LAB — CBC WITH DIFFERENTIAL/PLATELET
Basophils Absolute: 0 10*3/uL (ref 0.0–0.2)
Basos: 1 %
EOS (ABSOLUTE): 0.1 10*3/uL (ref 0.0–0.4)
Eos: 3 %
Hematocrit: 34.1 % (ref 34.0–46.6)
Hemoglobin: 10.6 g/dL — ABNORMAL LOW (ref 11.1–15.9)
Immature Grans (Abs): 0 10*3/uL (ref 0.0–0.1)
Immature Granulocytes: 0 %
Lymphocytes Absolute: 1.5 10*3/uL (ref 0.7–3.1)
Lymphs: 29 %
MCH: 24.8 pg — ABNORMAL LOW (ref 26.6–33.0)
MCHC: 31.1 g/dL — ABNORMAL LOW (ref 31.5–35.7)
MCV: 80 fL (ref 79–97)
Monocytes Absolute: 0.5 10*3/uL (ref 0.1–0.9)
Monocytes: 10 %
Neutrophils Absolute: 3 10*3/uL (ref 1.4–7.0)
Neutrophils: 57 %
Platelets: 231 10*3/uL (ref 150–450)
RBC: 4.28 x10E6/uL (ref 3.77–5.28)
RDW: 14.8 % (ref 11.7–15.4)
WBC: 5.2 10*3/uL (ref 3.4–10.8)

## 2020-06-03 LAB — BMP8+ANION GAP
Anion Gap: 15 mmol/L (ref 10.0–18.0)
BUN/Creatinine Ratio: 21 (ref 9–23)
BUN: 17 mg/dL (ref 6–24)
CO2: 21 mmol/L (ref 20–29)
Calcium: 9.5 mg/dL (ref 8.7–10.2)
Chloride: 105 mmol/L (ref 96–106)
Creatinine, Ser: 0.8 mg/dL (ref 0.57–1.00)
Glucose: 65 mg/dL (ref 65–99)
Potassium: 4.4 mmol/L (ref 3.5–5.2)
Sodium: 141 mmol/L (ref 134–144)
eGFR: 95 mL/min/{1.73_m2} (ref >59)

## 2020-06-03 LAB — IRON,TIBC AND FERRITIN PANEL
Ferritin: 38 ng/mL (ref 15–150)
Iron Saturation: 13 % — ABNORMAL LOW (ref 15–55)
Iron: 37 ug/dL (ref 27–159)
Total Iron Binding Capacity: 292 ug/dL (ref 250–450)
UIBC: 255 ug/dL (ref 131–425)

## 2020-06-03 LAB — HEMOGLOBIN A1C
Est. average glucose Bld gHb Est-mCnc: 105 mg/dL
Hgb A1c MFr Bld: 5.3 % (ref 4.8–5.6)

## 2020-06-03 MED ORDER — POLYSACCHARIDE IRON COMPLEX 150 MG PO CAPS: 150.0000 mg | ORAL_CAPSULE | Freq: Every day | ORAL | 1 refills | Status: DC

## 2020-06-03 MED ORDER — TERBINAFINE HCL 1 % EX CREA: 1.0000 "application " | TOPICAL_CREAM | Freq: Two times a day (BID) | CUTANEOUS | 0 refills | Status: DC

## 2020-06-03 NOTE — Telephone Encounter (Signed)
This encounter was created in error - please disregard.

## 2020-06-03 NOTE — Telephone Encounter (Signed)
Called patient and reviewed lab work. She has been off of iron supplementation. Will send prescription as below. Pharmacy also did not carry Terbinafine Gel and I will send new prescription of Terbinafine Cream.   There are no diagnoses linked to this encounter.

## 2020-06-06 NOTE — Progress Notes (Signed)
Internal Medicine Clinic Attending  Case discussed with Dr. Steen  At the time of the visit.  We reviewed the resident's history and exam and pertinent patient test results.  I agree with the assessment, diagnosis, and plan of care documented in the resident's note.  

## 2020-06-11 ENCOUNTER — Encounter: Payer: Self-pay | Admitting: *Deleted

## 2020-09-09 ENCOUNTER — Other Ambulatory Visit: Payer: Self-pay | Admitting: Internal Medicine

## 2020-09-09 DIAGNOSIS — Z1231 Encounter for screening mammogram for malignant neoplasm of breast: Secondary | ICD-10-CM

## 2020-10-28 ENCOUNTER — Other Ambulatory Visit: Payer: Self-pay

## 2020-10-28 ENCOUNTER — Ambulatory Visit
Admission: RE | Admit: 2020-10-28 | Discharge: 2020-10-28 | Disposition: A | Discharge status: Home / Self Care | Payer: BC Managed Care – PPO | Source: Ambulatory Visit | Attending: Nurse Practitioner | Admitting: Nurse Practitioner

## 2020-10-28 DIAGNOSIS — Z1231 Encounter for screening mammogram for malignant neoplasm of breast: Secondary | ICD-10-CM

## 2020-11-03 ENCOUNTER — Other Ambulatory Visit: Payer: Self-pay | Admitting: Nurse Practitioner

## 2020-11-03 DIAGNOSIS — R928 Other abnormal and inconclusive findings on diagnostic imaging of breast: Secondary | ICD-10-CM

## 2020-12-19 ENCOUNTER — Emergency Department (HOSPITAL_COMMUNITY): Payer: BC Managed Care – PPO

## 2020-12-19 ENCOUNTER — Encounter (HOSPITAL_COMMUNITY): Payer: Self-pay | Admitting: Emergency Medicine

## 2020-12-19 ENCOUNTER — Encounter: Payer: BC Managed Care – PPO | Admitting: Student

## 2020-12-19 ENCOUNTER — Emergency Department (HOSPITAL_COMMUNITY)
Admission: EM | Admit: 2020-12-19 | Discharge: 2020-12-20 | Disposition: A | Discharge status: Home / Self Care | Payer: BC Managed Care – PPO | Attending: Emergency Medicine | Admitting: Emergency Medicine

## 2020-12-19 ENCOUNTER — Other Ambulatory Visit: Payer: Self-pay

## 2020-12-19 DIAGNOSIS — M25551 Pain in right hip: Secondary | ICD-10-CM | POA: Diagnosis not present

## 2020-12-19 DIAGNOSIS — M79604 Pain in right leg: Secondary | ICD-10-CM | POA: Insufficient documentation

## 2020-12-19 DIAGNOSIS — W0110XA Fall on same level from slipping, tripping and stumbling with subsequent striking against unspecified object, initial encounter: Secondary | ICD-10-CM | POA: Diagnosis not present

## 2020-12-19 DIAGNOSIS — Z79899 Other long term (current) drug therapy: Secondary | ICD-10-CM | POA: Insufficient documentation

## 2020-12-19 DIAGNOSIS — W19XXXA Unspecified fall, initial encounter: Secondary | ICD-10-CM

## 2020-12-19 DIAGNOSIS — M545 Low back pain, unspecified: Secondary | ICD-10-CM | POA: Diagnosis present

## 2020-12-19 MED ORDER — OXYCODONE-ACETAMINOPHEN 5-325 MG PO TABS
1.0000 | ORAL_TABLET | Freq: Once | ORAL | Status: AC
  Administered 2020-12-19: 1 via ORAL
  Filled 2020-12-19: qty 1

## 2020-12-19 MED ORDER — ONDANSETRON 4 MG PO TBDP
4.0000 mg | ORAL_TABLET | Freq: Once | ORAL | Status: AC
  Administered 2020-12-19: 4 mg via ORAL
  Filled 2020-12-19: qty 1

## 2020-12-19 NOTE — ED Provider Notes (Signed)
Emergency Medicine Provider Triage Evaluation Note  Shanyla Janine Reller , a 40 y.o. female  was evaluated in triage.  Pt complains of fall. Pt states she was walking down the stairs when she slipped and fell onto her back. Denies head trauma or loc. She is c/o right lower back pain that radiates down the right leg. Denies numbness/weakness.  Review of Systems  Positive: Back pain, leg pain Negative: Head injury, loc  Physical Exam  BP 131/76   Pulse 74   Temp 98.1 F (36.7 C) (Oral)   Resp 16   SpO2 100%  Gen:   Awake, no distress   Resp:  Normal effort  MSK:   Moves extremities without difficulty  Other:  TTP to the right lumbar paraspinous muscles which reproduces pain, mild ttp to the left hip as well  Medical Decision Making  Medically screening exam initiated at 8:51 PM.  Appropriate orders placed.  Alohilani Ademidun Marines was informed that the remainder of the evaluation will be completed by another provider, this initial triage assessment does not replace that evaluation, and the importance of remaining in the ED until their evaluation is complete.     Rayne Du 12/19/20 2051    Jacalyn Lefevre, MD 12/19/20 2105

## 2020-12-19 NOTE — ED Triage Notes (Addendum)
Pt presents to ED BIB [facility] from home. Pt c/o lower back, R hip, and R flank pain s/p mechanical fall.  Landed on buttocks. Denies head injury, denies blood thinners, no LOC. EMS VSS.

## 2020-12-20 MED ORDER — HYDROCODONE-ACETAMINOPHEN 5-325 MG PO TABS: 1.0000 | ORAL_TABLET | ORAL | 0 refills | Status: DC | PRN

## 2020-12-20 MED ORDER — KETOROLAC TROMETHAMINE 60 MG/2ML IM SOLN
60.0000 mg | Freq: Once | INTRAMUSCULAR | Status: AC
  Administered 2020-12-20: 60 mg via INTRAMUSCULAR

## 2020-12-20 MED ORDER — METHYLPREDNISOLONE 4 MG PO TBPK: ORAL_TABLET | ORAL | 0 refills | Status: DC

## 2020-12-20 NOTE — Discharge Instructions (Signed)
Take the prescribed medication as directed. Follow-up with your primary care doctor. Return to the ED for new or worsening symptoms-- bowel or bladder incontinence, numbness, weakness, etc.
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
## 2020-12-21 ENCOUNTER — Ambulatory Visit: Admission: RE | Admit: 2020-12-21 | Payer: BC Managed Care – PPO | Source: Ambulatory Visit

## 2020-12-21 ENCOUNTER — Ambulatory Visit (INDEPENDENT_AMBULATORY_CARE_PROVIDER_SITE_OTHER): Payer: BC Managed Care – PPO | Admitting: Internal Medicine

## 2020-12-21 DIAGNOSIS — Z9181 History of falling: Secondary | ICD-10-CM

## 2020-12-21 DIAGNOSIS — Z0000 Encounter for general adult medical examination without abnormal findings: Secondary | ICD-10-CM

## 2020-12-21 DIAGNOSIS — S335XXA Sprain of ligaments of lumbar spine, initial encounter: Secondary | ICD-10-CM | POA: Diagnosis not present
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
## 2020-12-21 NOTE — Assessment & Plan Note (Addendum)
Pt here for follow up clinic appointment after an ED visit on 12/5 after falling down stairs landing on her right side. She complains of right sided lumbar back pain and right hip pain. She complains of a sharp, intermittent pain. No midline focal spinal tenderness. Sensation intact. No red flags. Vitals stable. XR of pelvis in ED negative for fracture. She was prescribed 10 oxycodone-acetominophen 5-325; she has not yet picked up this prescription. She has a lidocaine patch on. Emphasized the importance of mobility and stretching for long term resolution; she expresses understanding.   Oxycodone-acetominophen 5-325 as needed Advised to start use tylenol 1000 mg tid prn once narcotic course is completed if pain is ongoing  Continue using lidocaine patch  Thermotherapy  Stretching as tolerated Work note provided  F/u in 4 weeks; will consider PT referral if appropriate  Advised pt to call clinic if she notices worsening pain

## 2020-12-21 NOTE — Assessment & Plan Note (Signed)
Flu shot offered, pt states that she has already had it

## 2020-12-21 NOTE — Patient Instructions (Addendum)
Thank you, [name] for allowing [place] to provide your care today!  Today we discussed:  Back strain Start taking your medication prescribed in the ED After that is completed you can start taking tylenol 1000 mg up to 3 times a day (6 pills total in 1 day) Apply heat and ice to the area  Move around to keep those muscles loose! Very important  Call us if you do not notice an improvement in 4 weeks    Follow up in: 1 month    Should you have any questions or concerns please call the internal medicine clinic at [phone].     Carmel Sacramento, MD  Internal Medicine Resident, PGY-1 [facility]

## 2020-12-22 NOTE — Progress Notes (Signed)
Internal Medicine Clinic Attending  I saw and evaluated the patient.  I personally confirmed the key portions of the history and exam documented by Dr. Patel and I reviewed pertinent patient test results.  The assessment, diagnosis, and plan were formulated together and I agree with the documentation in the resident's note.  

## 2021-01-24 ENCOUNTER — Ambulatory Visit
Admission: RE | Admit: 2021-01-24 | Discharge: 2021-01-24 | Disposition: A | Discharge status: Home / Self Care | Payer: BC Managed Care – PPO | Source: Ambulatory Visit | Attending: Nurse Practitioner | Admitting: Nurse Practitioner

## 2021-01-24 DIAGNOSIS — R928 Other abnormal and inconclusive findings on diagnostic imaging of breast: Secondary | ICD-10-CM

## 2021-03-16 ENCOUNTER — Encounter: Payer: Self-pay | Admitting: Internal Medicine

## 2021-03-16 ENCOUNTER — Other Ambulatory Visit: Payer: Self-pay

## 2021-03-16 ENCOUNTER — Ambulatory Visit (INDEPENDENT_AMBULATORY_CARE_PROVIDER_SITE_OTHER): Payer: BC Managed Care – PPO | Admitting: Internal Medicine

## 2021-03-16 DIAGNOSIS — U071 COVID-19: Secondary | ICD-10-CM
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
## 2021-03-16 NOTE — Patient Instructions (Addendum)
Dear Teresa Barnett, ? ?Thank you for trusting [place] with your care. ? ?Your symptoms are likely related to your viral infection several weeks ago. These viral infections, especially COVID, can cause lingering symptoms.  ?We recommend that you take Robitussin DM and honey for your cough. Tylenol can help relieve some pain/discomfort as well. We also recommend that you continue to drink plenty of fluids.  ?We would like to see you back in the clinic in 1-2 months to ensure resolution of your symptoms.  ?

## 2021-03-17 ENCOUNTER — Encounter: Payer: Self-pay | Admitting: Internal Medicine

## 2021-03-17 DIAGNOSIS — U071 COVID-19: Secondary | ICD-10-CM | POA: Insufficient documentation

## 2021-03-17 HISTORY — DX: COVID-19: U07.1

## 2021-03-17 NOTE — Assessment & Plan Note (Signed)
-   recent Covid infection (02/20/21) - symptoms have persisted, did not feel improved at all since dx.  ?- cough continued past 3 weeks with tan colored mucus, sore throat (from coughing), chills and sweats at night. No taste ?- No pain or fever. No nasal discharge or sinus congestion. No fatigue, no SOB, no decrease in appetite, no myalgias ?- Has tried robitussin, nothing else. ?- left turbinate more boggy, right nares clear. Palate and mouth clear. No tenderness to palpation of head, face, or neck. Ears clear, cerumen left ?- Recommended Robitussin DM, honey, tylenol, recheck 1-2 months, eval for resolution of night sweats. ?

## 2021-03-21 NOTE — Progress Notes (Signed)
Internal Medicine Clinic Attending ? ?Case discussed with Dr. Burnice Logan  At the time of the visit.  We reviewed the resident?s history and exam and pertinent patient test results.  I agree with the assessment, diagnosis, and plan of care documented in the resident?s note. Symptoms appear consistent with post-viral URI and suspect these will continue to get better with time, however we advised return precautions if symptoms do not improve or worsen in the next few weeks.  ?

## 2021-11-21 ENCOUNTER — Other Ambulatory Visit: Payer: Self-pay | Admitting: Nurse Practitioner

## 2021-11-21 DIAGNOSIS — Z1231 Encounter for screening mammogram for malignant neoplasm of breast: Secondary | ICD-10-CM

## 2022-01-23 ENCOUNTER — Ambulatory Visit
Admission: RE | Admit: 2022-01-23 | Discharge: 2022-01-23 | Disposition: A | Discharge status: Home / Self Care | Payer: 59 | Source: Ambulatory Visit | Attending: Nurse Practitioner | Admitting: Nurse Practitioner

## 2022-01-23 DIAGNOSIS — Z1231 Encounter for screening mammogram for malignant neoplasm of breast: Secondary | ICD-10-CM

## 2022-02-22 ENCOUNTER — Emergency Department (HOSPITAL_COMMUNITY): Payer: 59

## 2022-02-22 ENCOUNTER — Emergency Department (HOSPITAL_COMMUNITY)
Admission: EM | Admit: 2022-02-22 | Discharge: 2022-02-22 | Disposition: A | Discharge status: Home / Self Care | Payer: 59 | Attending: Emergency Medicine | Admitting: Emergency Medicine

## 2022-02-22 DIAGNOSIS — S8992XA Unspecified injury of left lower leg, initial encounter: Secondary | ICD-10-CM | POA: Insufficient documentation

## 2022-02-22 DIAGNOSIS — W19XXXA Unspecified fall, initial encounter: Secondary | ICD-10-CM

## 2022-02-22 DIAGNOSIS — W0110XA Fall on same level from slipping, tripping and stumbling with subsequent striking against unspecified object, initial encounter: Secondary | ICD-10-CM | POA: Insufficient documentation

## 2022-02-22 MED ORDER — OXYCODONE-ACETAMINOPHEN 5-325 MG PO TABS: 1.0000 | ORAL_TABLET | Freq: Three times a day (TID) | ORAL | 0 refills | Status: DC | PRN

## 2022-02-22 MED ORDER — KETOROLAC TROMETHAMINE 15 MG/ML IJ SOLN
15.0000 mg | Freq: Once | INTRAMUSCULAR | Status: AC
  Administered 2022-02-22: 15 mg via INTRAVENOUS
  Filled 2022-02-22: qty 1

## 2022-02-22 MED ORDER — HYDROMORPHONE HCL 1 MG/ML IJ SOLN
0.5000 mg | Freq: Once | INTRAMUSCULAR | Status: AC
  Administered 2022-02-22: 0.5 mg via INTRAVENOUS
  Filled 2022-02-22: qty 1

## 2022-02-22 MED ORDER — ONDANSETRON HCL 4 MG/2ML IJ SOLN
4.0000 mg | Freq: Once | INTRAMUSCULAR | Status: AC
  Administered 2022-02-22: 4 mg via INTRAVENOUS
  Filled 2022-02-22: qty 2

## 2022-02-22 NOTE — Progress Notes (Signed)
Orthopedic Tech Progress Note Patient Details:  Teresa Barnett 1980/04/07 [id number]  Ortho Devices Type of Ortho Device: Knee Immobilizer Ortho Device/Splint Location: LLE Ortho Device/Splint Interventions: Adjustment, Ordered, Application   Post Interventions Patient Tolerated: Well  Teresa Barnett 02/22/2022, 2:22 PM

## 2022-02-22 NOTE — ED Triage Notes (Signed)
Pt BIB [facility] from a store where she had a mechanical fall. Pt just slipped and fell. Denied LOC or hitting her head. Deformity noted on her L knee. A&O X4.   50 mcg fentanyl given by EMS.
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
## 2022-02-26 ENCOUNTER — Ambulatory Visit (HOSPITAL_COMMUNITY)
Admission: RE | Admit: 2022-02-26 | Discharge: 2022-02-26 | Disposition: A | Discharge status: Home / Self Care | Payer: 59 | Source: Ambulatory Visit | Attending: Internal Medicine | Admitting: Internal Medicine

## 2022-02-26 ENCOUNTER — Ambulatory Visit (INDEPENDENT_AMBULATORY_CARE_PROVIDER_SITE_OTHER): Payer: 59

## 2022-02-26 VITALS — BP 116/76 | HR 91 | Temp 98.3°F | Ht 68.0 in | Wt 168.1 lb

## 2022-02-26 DIAGNOSIS — M12562 Traumatic arthropathy, left knee: Secondary | ICD-10-CM | POA: Insufficient documentation

## 2022-02-26 DIAGNOSIS — D500 Iron deficiency anemia secondary to blood loss (chronic): Secondary | ICD-10-CM

## 2022-02-26 DIAGNOSIS — G43109 Migraine with aura, not intractable, without status migrainosus: Secondary | ICD-10-CM

## 2022-02-26 DIAGNOSIS — M1712 Unilateral primary osteoarthritis, left knee: Secondary | ICD-10-CM

## 2022-02-26 MED ORDER — DICLOFENAC SODIUM 1 % EX GEL: 4.0000 g | Freq: Four times a day (QID) | CUTANEOUS | 0 refills | Status: DC

## 2022-02-26 MED ORDER — POLYSACCHARIDE IRON COMPLEX 150 MG PO CAPS: 150.0000 mg | ORAL_CAPSULE | Freq: Every day | ORAL | 1 refills | Status: DC

## 2022-02-26 MED ORDER — DICLOFENAC SODIUM 1 % EX GEL: 4.0000 g | Freq: Four times a day (QID) | CUTANEOUS | 0 refills | Status: AC

## 2022-02-26 MED ORDER — SUMATRIPTAN SUCCINATE 50 MG PO TABS: ORAL_TABLET | ORAL | 1 refills | Status: DC

## 2022-02-26 NOTE — Patient Instructions (Addendum)
Thank you, [name] for allowing [place] to provide your care today. Today we discussed :  Knee pain: You have swelling and pain over the knee that you fell on. We will repeat the xray to see if there are any fractures. These will be done here in the hospital. Sometimes these do not initially show up. I am referring you to the orthopedic doctors as well . You can use tylenol 1000 mg four times daily. You can use ibuprofen 623m every 4 hours as needed. I have sent topical voltaren gel to the pharmacy, you can also get this over the counter if it is cheaper. You should use heat and ice on the leg, rest the leg, and elevate it when you can.         We look forward to seeing you next time. Please call our clinic at [phone] you have any questions or concerns. The best time to call is Monday-Friday from 9am-4pm, but there is someone available 24/7. If after hours or the weekend, call the main hospital number and ask for the Internal Medicine Resident On-Call. If you need medication refills, please notify your pharmacy one week in advance and they will send [place] request.   Thank you for trusting me with your care. Wishing you the best!   TIona Coach MD [facility]

## 2022-02-26 NOTE — Assessment & Plan Note (Signed)
Patient fell on L knee in the store on 2/8 after slipping on a puddle. Went to the ED and had hip and knee radiographs taken with no fracture. Pain 10/10 since and not able to walk without sever limp and help. 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 Has been taking percocet 5-346m q8 hrs prescribed by ED. Still has 5 pills left. Never had narcotic pain meds prior. On exam patient able to bear wait but has abnormal gait secondary to pain with severe limp. PTP out of proportion to the exam. Crepitus over the R patella. Some swelling superior to the patella. Difficult to perform drawer signs, abduction and adduction at the knee joint, mcmurrays test due to pain but on performance no gross laxity noted. This may be a fracture that was not picked up on prior radiographs. Will repeat imaging today. Low concern for complete tear of ligamentous structure given no appreciated laxity and no overt effusion. Will recommend RICE, pain control and ortho referral. -orthopedic referral -tylenol 1007mQID prn -ibuprofen 60068m4 hrs prn -voltaren gel -RICE -F/u xray L knee

## 2022-02-26 NOTE — Progress Notes (Signed)
Established Patient Office Visit  Subjective   Patient ID: Teresa Barnett, female    DOB: 10-31-1980  Age: 42 y.o. MRN: [id number]  Chief Complaint  Patient presents with   Knee Pain   Follow-up    Ms. Teresa Barnett is a 42 y/o female with a pmh outlined below. Please see A&P for HPI information.  Knee Pain       Review of Systems  All other systems reviewed and are negative.     Objective:     BP 116/76 (BP Location: Right Arm, Patient Position: Sitting, Cuff Size: Small)   Pulse 91   Temp 98.3 F (36.8 C) (Oral)   Ht 5' 8"$  (1.727 m)   Wt 168 lb 1.6 oz (76.2 kg)   LMP 01/29/2022 (Approximate)   SpO2 100%   BMI 25.56 kg/m    Physical Exam Constitutional:      General: She is in acute distress.     Appearance: Normal appearance. She is normal weight.  Musculoskeletal:        General: No deformity.     Right lower leg: No edema.     Left lower leg: No edema.     Comments: Patient able to bear wait but has abnormal gait secondary to pain with severe limp. PTP out of proportion to the exam. Crepitus over the R patella. Some swelling superior to the patella. Difficult to perform drawer signs, abduction and adduction at the knee joint, mcmurrays test due to pain but on performance no gross laxity noted. Pain 3/5 at the hip and knee on the left likely secondary to pain. Limited ROM on hip and knee flexion/extension secondary to pain.  Skin:    General: Skin is warm and dry.     Findings: No bruising.  Neurological:     Mental Status: She is alert.     Gait: Gait abnormal.      No results found for any visits on 02/26/22.    The ASCVD Risk score (Arnett DK, et al., 2019) failed to calculate for the following reasons:   Cannot find a previous HDL lab   Cannot find a previous total cholesterol lab    Assessment & Plan:   Problem List Items Addressed This Visit       Cardiovascular and Mediastinum   Migraine with aura and without status migrainosus,  not intractable (Chronic)   Relevant Medications   SUMAtriptan (IMITREX) 50 MG tablet     Musculoskeletal and Integument   Arthropathy, traumatic, knee, left - Primary    Patient fell on L knee in the store on 2/8 after slipping on a puddle. Went to the ED and had hip and knee radiographs taken with no fracture. Pain 10/10 since and not able to walk without sever limp and help. 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 Has been taking percocet 5-31m q8 hrs prescribed by ED. Still has 5 pills left. Never had narcotic pain meds prior. On exam patient able to bear wait but has abnormal gait secondary to pain with severe limp. PTP out of proportion to the exam. Crepitus over the R patella. Some swelling superior to the patella. Difficult to perform drawer signs, abduction and adduction at the knee joint, mcmurrays test due to pain but on performance no gross laxity noted. This may be a fracture that was not picked up on prior radiographs. Will repeat imaging today. Low concern for complete tear of ligamentous structure given  no appreciated laxity and no overt effusion. Will recommend RICE, pain control and ortho referral. -orthopedic referral -tylenol 1093m QID prn -ibuprofen 6070mq4 hrs prn -voltaren gel -RICE -F/u xray L knee      Relevant Medications   diclofenac Sodium (VOLTAREN) 1 % GEL   Other Relevant Orders   DG Knee Complete 4 Views Left   Ambulatory referral to Orthopedic Surgery     Other   Iron deficiency anemia due to chronic blood loss (Chronic)   Relevant Medications   iron polysaccharides (NU-IRON) 150 MG capsule    No follow-ups on file.    TyIona CoachMD

## 2022-02-27 ENCOUNTER — Encounter: Payer: 59 | Admitting: Internal Medicine

## 2022-02-27 ENCOUNTER — Telehealth: Payer: Self-pay

## 2022-02-27 NOTE — Progress Notes (Signed)
Internal Medicine Clinic Attending  Case discussed with Dr. Stann Mainland  at the time of the visit.  We reviewed the resident's history and exam and pertinent patient test results.  I agree with the assessment, diagnosis, and plan of care documented in the resident's note.

## 2022-02-27 NOTE — Telephone Encounter (Signed)
Decision:Approved [employer] ([name]) PA Case ID #: [id number] Rx #: [id number] Need Help? Call us at [phone] Outcome Approved today CaseId:[id number];Status:Approved;Review Type:Prior Auth;Coverage Start Date:01/28/2022;Coverage End Date:02/27/2023; Authorization Expiration Date: 02/26/2023 Drug Diclofenac Sodium 1% gel ePA cloud logo Form [employer] Electronic PA Form [phone] NCPDP) Original Claim Info [place]

## 2022-02-27 NOTE — Telephone Encounter (Signed)
Prior Authorization for patient (Diclofenac sodium) came through on cover my meds was submitted with last office notes awaiting approval or denial.

## 2022-03-02 ENCOUNTER — Encounter: Payer: Self-pay | Admitting: Orthopaedic Surgery

## 2022-03-02 ENCOUNTER — Ambulatory Visit (INDEPENDENT_AMBULATORY_CARE_PROVIDER_SITE_OTHER): Payer: 59 | Admitting: Orthopaedic Surgery

## 2022-03-02 DIAGNOSIS — M25562 Pain in left knee: Secondary | ICD-10-CM

## 2022-03-02 NOTE — Addendum Note (Signed)
Addended by: Iona Coach on: 03/02/2022 08:20 AM   Modules accepted: Orders

## 2022-03-02 NOTE — Progress Notes (Signed)
Called patient, discussed normal knee radiograph with no fractures or effusion. No need for further imaging at this time given no effusion and no laxity on exam. Will order PT, patient is amenable. Pending ortho visit as well.
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
## 2022-03-05 ENCOUNTER — Ambulatory Visit (INDEPENDENT_AMBULATORY_CARE_PROVIDER_SITE_OTHER): Payer: 59 | Admitting: Internal Medicine

## 2022-03-05 ENCOUNTER — Encounter: Payer: Self-pay | Admitting: Internal Medicine

## 2022-03-05 ENCOUNTER — Other Ambulatory Visit: Payer: Self-pay

## 2022-03-05 VITALS — BP 126/64 | HR 85 | Temp 98.4°F | Ht 68.0 in | Wt 172.2 lb

## 2022-03-05 DIAGNOSIS — E663 Overweight: Secondary | ICD-10-CM

## 2022-03-05 DIAGNOSIS — G43109 Migraine with aura, not intractable, without status migrainosus: Secondary | ICD-10-CM | POA: Diagnosis not present

## 2022-03-05 DIAGNOSIS — Z0000 Encounter for general adult medical examination without abnormal findings: Secondary | ICD-10-CM

## 2022-03-05 DIAGNOSIS — M12562 Traumatic arthropathy, left knee: Secondary | ICD-10-CM | POA: Diagnosis not present

## 2022-03-05 DIAGNOSIS — Z13220 Encounter for screening for lipoid disorders: Secondary | ICD-10-CM

## 2022-03-05 DIAGNOSIS — D500 Iron deficiency anemia secondary to blood loss (chronic): Secondary | ICD-10-CM | POA: Diagnosis not present

## 2022-03-05 DIAGNOSIS — Z6825 Body mass index (BMI) 25.0-25.9, adult: Secondary | ICD-10-CM

## 2022-03-05 DIAGNOSIS — B360 Pityriasis versicolor: Secondary | ICD-10-CM | POA: Diagnosis not present

## 2022-03-05 DIAGNOSIS — Z1159 Encounter for screening for other viral diseases: Secondary | ICD-10-CM | POA: Insufficient documentation

## 2022-03-05 DIAGNOSIS — G4700 Insomnia, unspecified: Secondary | ICD-10-CM

## 2022-03-05 MED ORDER — FERROUS SULFATE 325 (65 FE) MG PO TABS: 325.0000 mg | ORAL_TABLET | Freq: Every day | ORAL | 0 refills | Status: DC

## 2022-03-05 MED ORDER — KETOCONAZOLE 2 % EX SHAM: MEDICATED_SHAMPOO | CUTANEOUS | 0 refills | Status: AC

## 2022-03-05 NOTE — Assessment & Plan Note (Signed)
Hypopigmented, pruritic rash over patient's mid-back. Similar appearance to rash seen 05/2020. Appears consistent with tinea versicolor. Prescribed ketoconazole shampoo for three day application.

## 2022-03-05 NOTE — Assessment & Plan Note (Signed)
Not currently using any medication for sleep and does not note significant insomnia. Will resolve.

## 2022-03-05 NOTE — Assessment & Plan Note (Signed)
Mild overweight with BMI 26.18. We discussed physical activity today. Teresa Barnett does not participate in any formal exercise but reports being very active with her job and home responsibilities. She feels she eats a healthy diet including plenty of fruits, vegetable, lean meats, and whole grains. She does not eat many processed foods and little fast food. Continue healthy diet and exercise.

## 2022-03-05 NOTE — Assessment & Plan Note (Signed)
Migraine headaches about once every 3 months. She has not found sumatriptan helpful, however Advil does eliminate headaches. We discussed continuing Advil as needed for migraines and have removed sumatriptan from medication list.

## 2022-03-05 NOTE — Assessment & Plan Note (Signed)
Mild overweight with BMI 26.18. Screening for diabetes was negative in 2022. Will screen for lipid disorders today.

## 2022-03-05 NOTE — Assessment & Plan Note (Signed)
History of iron deficiency anemia, previously thought secondary to heavy menstruation although she denied this at prior appointment with Dr. Court Joy 05/2020. She has continued to take iron supplementation. We discussed updating labs today for further management.  Plan -CBC, iron studies

## 2022-03-05 NOTE — Assessment & Plan Note (Signed)
Hep C Ab w/ reflex ordered.

## 2022-03-05 NOTE — Progress Notes (Signed)
Established Patient Office Visit  Subjective   Patient ID: Teresa Barnett, female    DOB: 02/14/1980  Age: 42 y.o. MRN: [id number]  Chief Complaint  Patient presents with   Check-up Visit    No c/o's except intermittent knee pain.    Teresa Barnett is a 42 yo person who returns to clinic for follow-up of chronic conditions including migraine, iron deficiency anemia, and overweight, as well as to discuss rash on her back and left knee pain. Please see assessment/plan in problem-based charting for further details of today's visit.    Patient Active Problem List   Diagnosis Date Noted   Need for hepatitis C screening test 03/05/2022   Screening cholesterol level 03/05/2022   Overweight (BMI 25.0-29.9) 03/05/2022   Arthropathy, traumatic, knee, left 02/26/2022   Tinea versicolor 06/02/2020   Iron deficiency anemia due to chronic blood loss 04/21/2019   Migraine with aura and without status migrainosus, not intractable 04/28/2018   Healthcare maintenance 12/04/2016     Objective:     BP 126/64 (BP Location: Left Arm, Patient Position: Sitting, Cuff Size: Normal)   Pulse 85   Temp 98.4 F (36.9 C) (Oral)   Ht 5' 8"$  (1.727 m)   Wt 172 lb 3.2 oz (78.1 kg)   LMP 01/29/2022 (Approximate)   SpO2 100% Comment: RA  BMI 26.18 kg/m  BP Readings from Last 3 Encounters:  03/05/22 126/64  02/26/22 116/76  02/22/22 118/82   Wt Readings from Last 3 Encounters:  03/05/22 172 lb 3.2 oz (78.1 kg)  02/26/22 168 lb 1.6 oz (76.2 kg)  03/16/21 166 lb 6.4 oz (75.5 kg)    Physical Exam Vitals and nursing note reviewed.  Constitutional:      General: She is not in acute distress.    Appearance: Normal appearance. She is not ill-appearing.  Cardiovascular:     Rate and Rhythm: Normal rate and regular rhythm.     Heart sounds: Normal heart sounds.  Pulmonary:     Effort: Pulmonary effort is normal.     Breath sounds: Normal breath sounds.  Musculoskeletal:        General:  Normal range of motion.     Comments: No swelling, TTP or limited ROM of left knee  Neurological:     General: No focal deficit present.     Mental Status: She is alert and oriented to person, place, and time.     Gait: Gait normal.  Psychiatric:        Mood and Affect: Mood normal.        Behavior: Behavior normal.      Assessment & Plan:   Problem List Items Addressed This Visit       Cardiovascular and Mediastinum   Migraine with aura and without status migrainosus, not intractable (Chronic)    Migraine headaches about once every 3 months. She has not found sumatriptan helpful, however Advil does eliminate headaches. We discussed continuing Advil as needed for migraines and have removed sumatriptan from medication list.        Musculoskeletal and Integument   Tinea versicolor    Hypopigmented, pruritic rash over patient's mid-back. Similar appearance to rash seen 05/2020. Appears consistent with tinea versicolor. Prescribed ketoconazole shampoo for three day application.      Relevant Medications   ketoconazole (NIZORAL) 2 % shampoo   Arthropathy, traumatic, knee, left    Significant improvement in left knee pain following fall on 2/8. She was at the store and slipped  on a puddle, landing on her left knee. She notes the pain was initially excruciating and she was unable to bear weight or walk on the left leg. She was seen in the ED and prescribed oxycodone-acetaminophen which is now completed. She followed up in [facility] 2/12 where she was noted to still have difficulty bearing weight and had an abnormal gait secondary to pain. XR of the knee at that visit was negative for fracture. She was referred to physical therapy and orthopedics, with recommendations for RICE, tylenol, and ibuprofen as needed for pain. She saw orthopedics 03/02/22 and they are recommending MRI which is scheduled for tomorrow 2/20. On examination today, she is able to walk without gait abnormality and reports pain is  much improved. No TTP over the patella, no significant effusion. She is able to straighten the leg to full extension. She reports acetaminophen works well for her and takes all the pain away. She did not pick up Voltaren gel. She plans to return to work on Thursday, 2/22. [employer] paperwork completed and placed in medical records for faxing.  Plan -F/u MRI 2/20, f/u with orthopedics -Pending PT referral -Continue acetaminophen 1000 qid prn. May try ibuprofen or voltaren gel if needed.        Other   RESOLVED: Insomnia (Chronic)    Not currently using any medication for sleep and does not note significant insomnia. Will resolve.      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 UTD on mammogram and cervical cancer screening.      Iron deficiency anemia due to chronic blood loss - Primary (Chronic)    History of iron deficiency anemia, previously thought secondary to heavy menstruation although she denied this at prior appointment with Dr. Court Joy 05/2020. She has continued to take iron supplementation. We discussed updating labs today for further management.  Plan -CBC, iron studies      Relevant Medications   ferrous sulfate 325 (65 FE) MG tablet   Other Relevant Orders   CBC no Diff   Iron, TIBC and Ferritin Panel   Need for hepatitis C screening test    Hep C Ab w/ reflex ordered.      Relevant Orders   Hepatitis C Ab reflex to Quant PCR   Screening cholesterol level    Mild overweight with BMI 26.18. Screening for diabetes was negative in 2022. Will screen for lipid disorders today.      Relevant Orders   Lipid Profile   Overweight (BMI 25.0-29.9)    Mild overweight with BMI 26.18. We discussed physical activity today. Teresa Barnett does not participate in any formal exercise but reports being very active with her job and home responsibilities. She feels she eats a healthy  diet including plenty of fruits, vegetable, lean meats, and whole grains. She does not eat many processed foods and little fast food. Continue healthy diet and exercise.       Return in about 1 year (around 03/06/2023).    Charise Killian, MD

## 2022-03-05 NOTE — Patient Instructions (Signed)
It was wonderful to see you today!  For the rash on your back--please use the ketoconazole shampoo to the area once daily and leave for five minutes before washing off. Do this for three days in a row.  We are checking labs for blood counts, iron levels, and hepatitis C screening. I will call you if there are any changes we need to make.  Please continue to follow-up with orthopedics for the knee pain and MRI.

## 2022-03-05 NOTE — Assessment & Plan Note (Addendum)
Significant improvement in left knee pain following fall on 2/8. She was at the store and slipped on a puddle, landing on her left knee. She notes the pain was initially excruciating and she was unable to bear weight or walk on the left leg. She was seen in the ED and prescribed oxycodone-acetaminophen which is now completed. She followed up in [facility] 2/12 where she was noted to still have difficulty bearing weight and had an abnormal gait secondary to pain. XR of the knee at that visit was negative for fracture. She was referred to physical therapy and orthopedics, with recommendations for RICE, tylenol, and ibuprofen as needed for pain. She saw orthopedics 03/02/22 and they are recommending MRI which is scheduled for tomorrow 2/20. On examination today, she is able to walk without gait abnormality and reports pain is much improved. No TTP over the patella, no significant effusion. She is able to straighten the leg to full extension. She reports acetaminophen works well for her and takes all the pain away. She did not pick up Voltaren gel. She plans to return to work on Thursday, 2/22. [employer] paperwork completed and placed in medical records for faxing.  Plan -F/u MRI 2/20, f/u with orthopedics -Pending PT referral -Continue acetaminophen 1000 qid prn. May try ibuprofen or voltaren gel if needed.

## 2022-03-05 NOTE — Assessment & Plan Note (Signed)
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 UTD on mammogram and cervical cancer screening.

## 2022-03-06 ENCOUNTER — Ambulatory Visit
Admission: RE | Admit: 2022-03-06 | Discharge: 2022-03-06 | Disposition: A | Discharge status: Home / Self Care | Payer: 59 | Source: Ambulatory Visit | Attending: Orthopaedic Surgery | Admitting: Orthopaedic Surgery

## 2022-03-06 DIAGNOSIS — M25562 Pain in left knee: Secondary | ICD-10-CM

## 2022-03-06 LAB — CBC
Hematocrit: 36.9 % (ref 34.0–46.6)
Hemoglobin: 11.5 g/dL (ref 11.1–15.9)
MCH: 24.9 pg — ABNORMAL LOW (ref 26.6–33.0)
MCHC: 31.2 g/dL — ABNORMAL LOW (ref 31.5–35.7)
MCV: 80 fL (ref 79–97)
Platelets: 244 10*3/uL (ref 150–450)
RBC: 4.62 x10E6/uL (ref 3.77–5.28)
RDW: 14 % (ref 11.7–15.4)
WBC: 5.3 10*3/uL (ref 3.4–10.8)

## 2022-03-06 LAB — HCV AB W REFLEX TO QUANT PCR: HCV Ab: NONREACTIVE

## 2022-03-06 LAB — HCV INTERPRETATION

## 2022-03-06 LAB — LIPID PANEL
Chol/HDL Ratio: 3 ratio (ref 0.0–4.4)
Cholesterol, Total: 160 mg/dL (ref 100–199)
HDL: 53 mg/dL (ref >39)
LDL Chol Calc (NIH): 97 mg/dL (ref 0–99)
Triglycerides: 50 mg/dL (ref 0–149)
VLDL Cholesterol Cal: 10 mg/dL (ref 5–40)

## 2022-03-06 LAB — IRON,TIBC AND FERRITIN PANEL
Ferritin: 40 ng/mL (ref 15–150)
Iron Saturation: 22 % (ref 15–55)
Iron: 67 ug/dL (ref 27–159)
Total Iron Binding Capacity: 303 ug/dL (ref 250–450)
UIBC: 236 ug/dL (ref 131–425)

## 2022-03-08 NOTE — Progress Notes (Signed)
ASCVD risk 0.6%. Repeat in 3-5 years.

## 2022-03-08 NOTE — Progress Notes (Signed)
Anemia resolved and iron studies improved to normal with oral supplementation. Would like to discuss discontinuing iron supplements and monitoring for future recurrence. Teresa Barnett was not available by telephone 2/22, will attempt at a later time.

## 2022-03-08 NOTE — Progress Notes (Signed)
Negative hepatitis C screening

## 2022-03-13 ENCOUNTER — Telehealth: Payer: Self-pay | Admitting: Internal Medicine

## 2022-03-13 NOTE — Telephone Encounter (Signed)
Reviewed results from 2/19 visit. Ms. Pinn will stop taking iron supplements and we will check levels at future visit.

## 2022-03-20 ENCOUNTER — Ambulatory Visit: Payer: 59 | Attending: Internal Medicine

## 2022-03-21 ENCOUNTER — Encounter: Payer: Self-pay | Admitting: Orthopaedic Surgery

## 2022-03-21 ENCOUNTER — Ambulatory Visit (INDEPENDENT_AMBULATORY_CARE_PROVIDER_SITE_OTHER): Payer: 59 | Admitting: Orthopaedic Surgery

## 2022-03-21 DIAGNOSIS — M25562 Pain in left knee: Secondary | ICD-10-CM
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
## 2022-04-02 ENCOUNTER — Other Ambulatory Visit: Payer: Self-pay | Admitting: Internal Medicine

## 2022-04-02 DIAGNOSIS — B360 Pityriasis versicolor: Secondary | ICD-10-CM

## 2022-04-02 NOTE — Telephone Encounter (Signed)
Call to patient when asked about the need for refill of the Ketoconazole Shampoo stated that the rash has cleared.  Did need ask for refill.  Thinks it was a pharmacy mistake as her son is in need of the refill.

## 2022-06-01 ENCOUNTER — Other Ambulatory Visit: Payer: Self-pay | Admitting: Internal Medicine

## 2022-06-01 DIAGNOSIS — D500 Iron deficiency anemia secondary to blood loss (chronic): Secondary | ICD-10-CM

## 2022-06-01 NOTE — Telephone Encounter (Signed)
Iron discontinued after previous appointment with normalized iron studies and resolution of anemia, plan for recheck at future visit.

## 2022-06-14 ENCOUNTER — Encounter: Payer: 59 | Admitting: Student

## 2022-06-18 ENCOUNTER — Encounter: Payer: 59 | Admitting: Student

## 2022-06-27 ENCOUNTER — Encounter: Payer: Self-pay | Admitting: Student

## 2022-06-27 ENCOUNTER — Ambulatory Visit (INDEPENDENT_AMBULATORY_CARE_PROVIDER_SITE_OTHER): Payer: 59 | Admitting: Student

## 2022-06-27 VITALS — BP 126/74 | HR 91 | Temp 98.9°F | Ht 68.0 in | Wt 173.4 lb

## 2022-06-27 DIAGNOSIS — H938X2 Other specified disorders of left ear: Secondary | ICD-10-CM | POA: Diagnosis not present

## 2022-06-27 DIAGNOSIS — H66002 Acute suppurative otitis media without spontaneous rupture of ear drum, left ear: Secondary | ICD-10-CM

## 2022-06-27 MED ORDER — AMOXICILLIN-POT CLAVULANATE 875-125 MG PO TABS: 1.0000 | ORAL_TABLET | Freq: Two times a day (BID) | ORAL | 0 refills | Status: AC

## 2022-06-27 NOTE — Patient Instructions (Signed)
Thank you, [name] for allowing [place] to provide your care today. Today we discussed your ear fullness  New medications: Augmentin - take 1 pill every 12 hours for 5 days. NSAIDs are good to control pain if you experience it   I have ordered the following labs for you:  Lab Orders  No laboratory test(s) ordered today     I will call if any are abnormal. All of your labs can be accessed through "My Chart".   My Chart Access: [email or web address]  Please follow-up in: if symptoms are not getting better or your experience fevers, or hearing loss, please call us and we will send referral to ENT    We look forward to seeing you next time. Please call our clinic at [phone] if you have any questions or concerns. The best time to call is Monday-Friday from 9am-4pm, but there is someone available 24/7. If after hours or the weekend, call the main hospital number and ask for the Internal Medicine Resident On-Call. If you need medication refills, please notify your pharmacy one week in advance and they will send [place] a request.   Thank you for letting us take part in your care. Wishing you the best!  Morene Crocker, MD 06/27/2022, 3:22 PM [facility] Internal Medicine Resident, PGY-1
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
## 2022-06-28 ENCOUNTER — Encounter: Payer: Self-pay | Admitting: Student

## 2022-06-28 DIAGNOSIS — H938X2 Other specified disorders of left ear: Secondary | ICD-10-CM | POA: Insufficient documentation

## 2022-06-28 NOTE — Assessment & Plan Note (Addendum)
Patient presenting to clinic with L ear fullness, intermittent, for ~ one month. Initially got better but then worsened with now sustained fullness and discomfort for the past two weeks. She denies fevers, decreased hearing, swelling, rhinorrhea, coryza, recent illness, difficulty swallowing.  She does report increased pressure and sensation of fluid in her ear but no drainage. No imbalance or vertigo.   On examination, face appears symmertrical no tenderness to palpation around surrounding ear, no lymphadenopathy, or abnormalities on external ear examination. No pain with pinna. R ear wnl. L ear canal without erythema, however, TM appear  bulging at the supperious edges with large amount of gray-white material pealing off of it without fluid or drainage and surrounding erythyma. No blood. Unable to assess for TM rupture at this time.   Given the timing and presentation, suspect suppurative otitis media with worsening effusion. Patient had tried daughter's topical treatment for a couple of days. No other over the counter medication. Will trial Augmentin therapy 875-125 mg BID for 5 days. Patient was instructed to return to clinic/call if worsening fullness, pain, or hearing changes. At that point, she should urgently be referred to ENT. - PO Augmentin therapy 875-125 mg BID for 5 days.  - RTC if worsening and consider ENT referral if no improvement - Discussed NSAIDs if patient developed pain

## 2022-06-29 NOTE — Addendum Note (Signed)
Addended by: Dickie La on: 06/29/2022 09:53 AM   Modules accepted: Level of Service

## 2022-06-29 NOTE — Progress Notes (Signed)
Internal Medicine Clinic Attending  I saw and evaluated the patient.  I personally confirmed the key portions of the history and exam documented by Dr. Gomez-Caraballo and I reviewed pertinent patient test results.  The assessment, diagnosis, and plan were formulated together and I agree with the documentation in the resident's note.  

## 2022-12-03 IMAGING — MG MM DIGITAL SCREENING BILAT W/ TOMO AND CAD
8 series · 9 of 24 positions shown · non-contrast
Comparison: Previous exam(s).

CLINICAL DATA: Screening.

EXAM:
DIGITAL SCREENING BILATERAL MAMMOGRAM WITH TOMOSYNTHESIS AND CAD
TECHNIQUE: Bilateral screening digital craniocaudal and mediolateral oblique
mammograms were obtained. Bilateral screening digital breast
tomosynthesis was performed. The images were evaluated with
computer-aided detection.

[L MLO synth-2D]
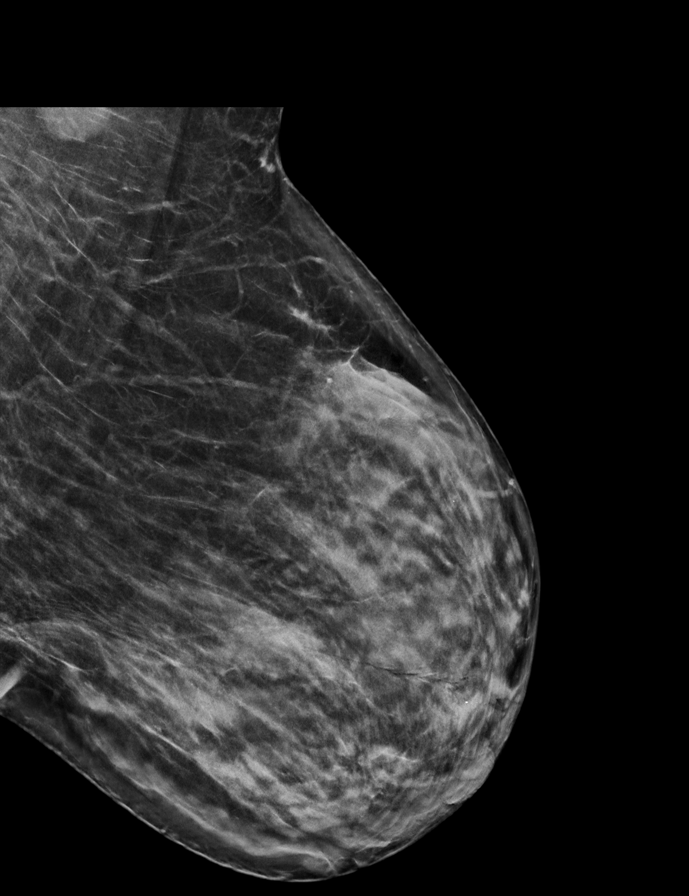

[L CC synth-2D]
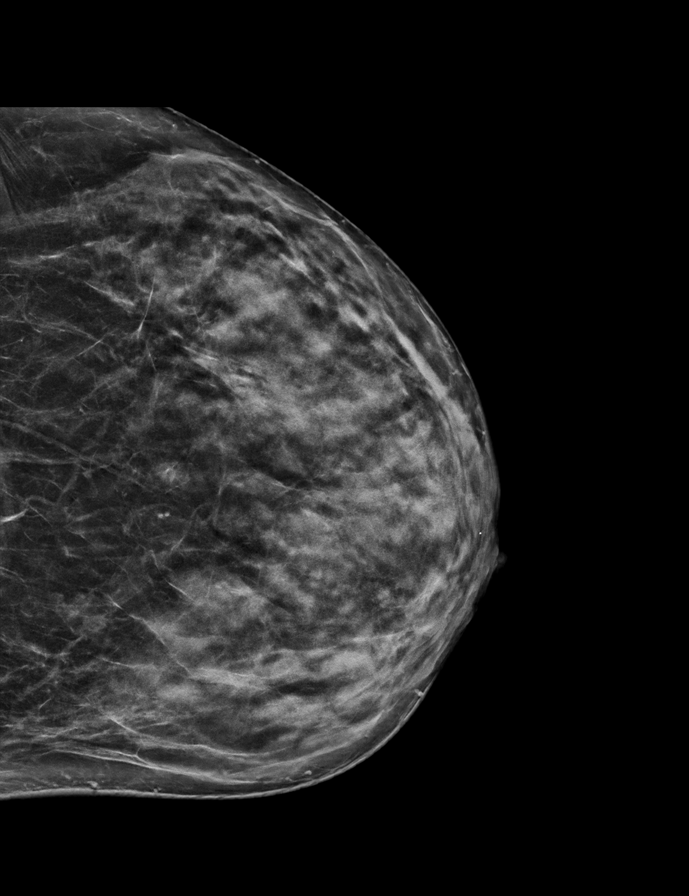

[R CC synth-2D]
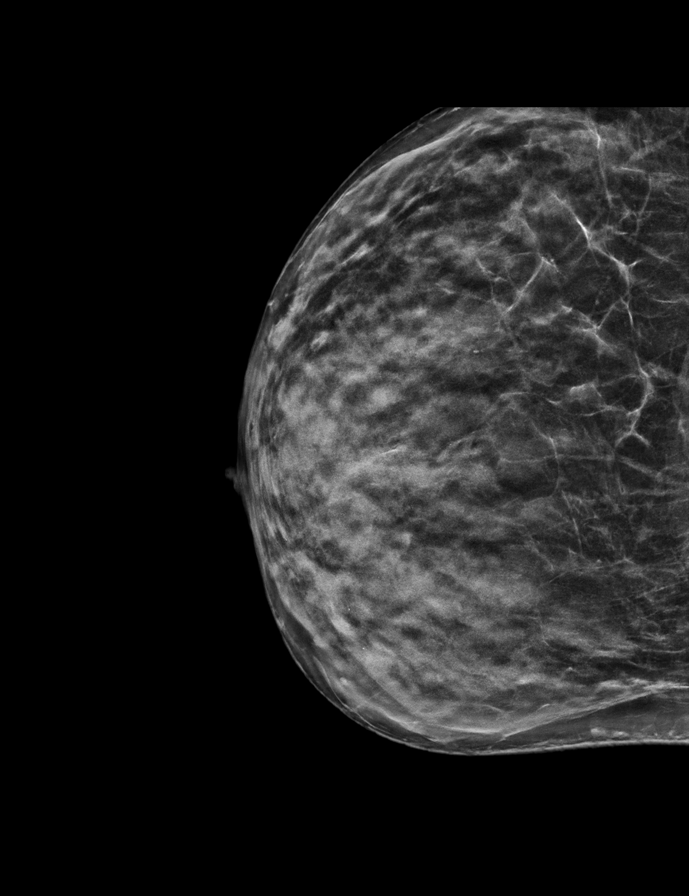

[R MLO synth-2D]
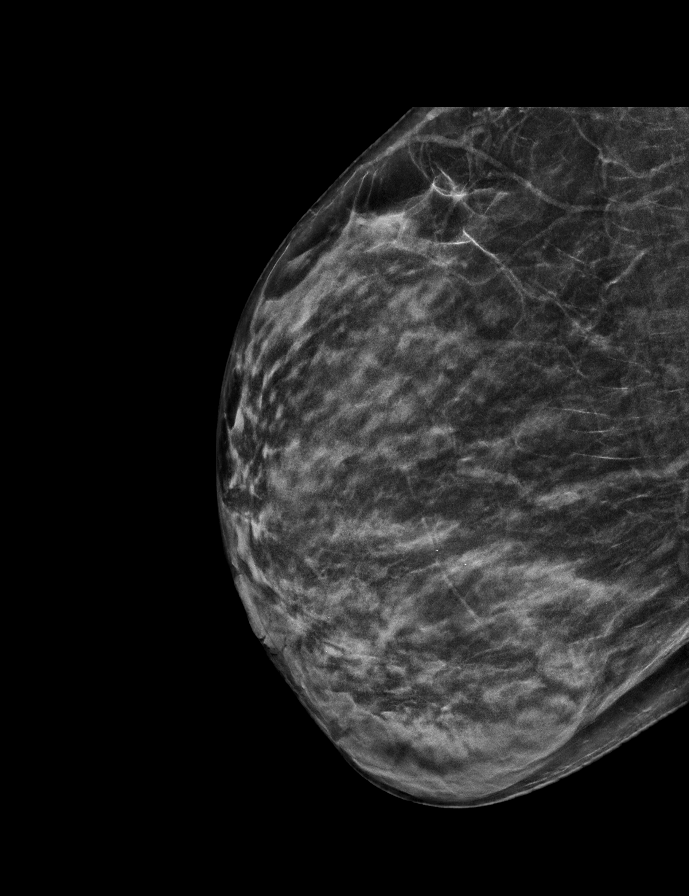

[L CC tomo · 2 of 65 frames shown]
[frame 21/65]
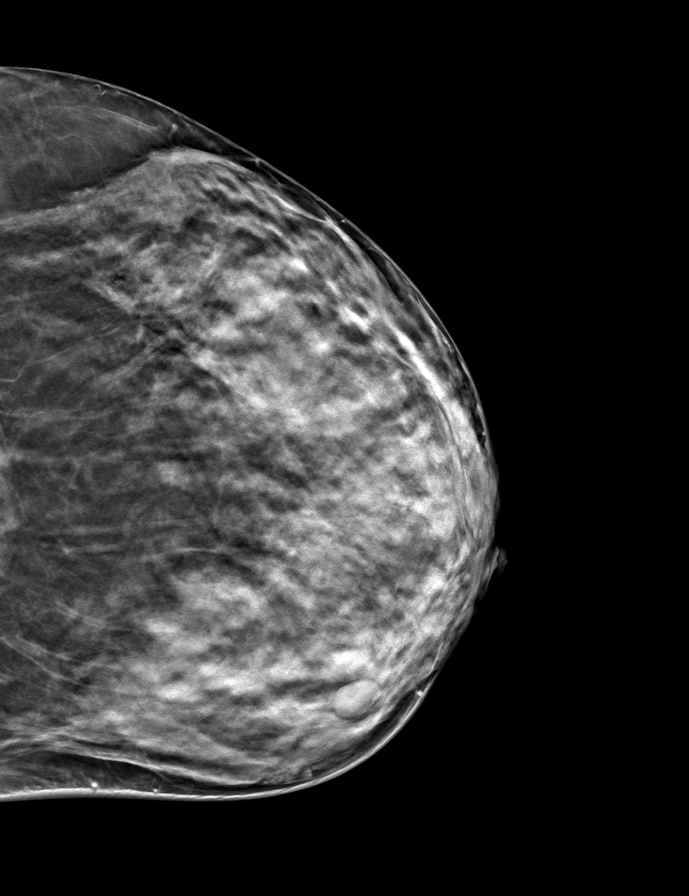
[frame 33/65]
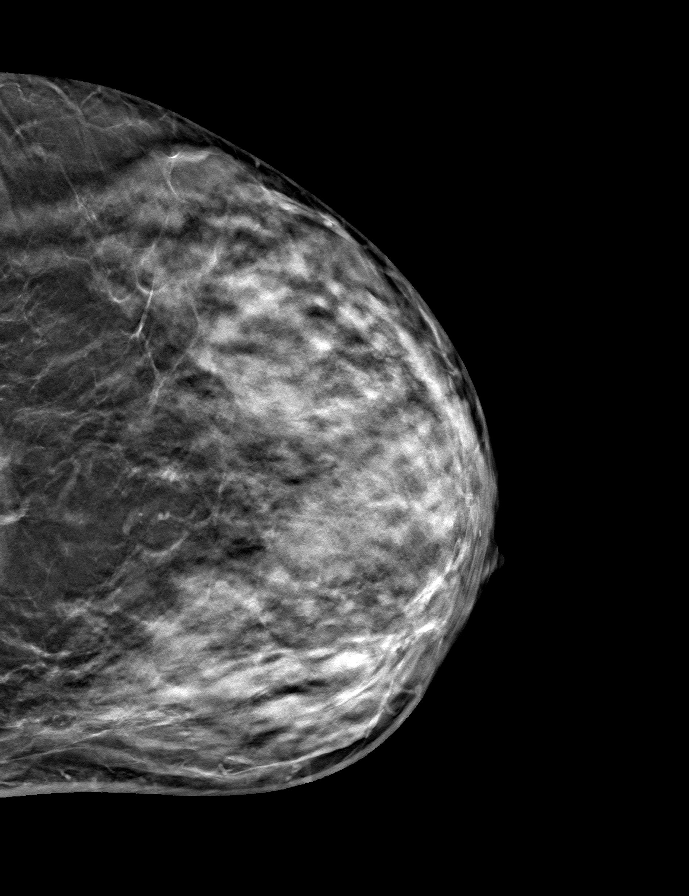

[R MLO tomo · tomo slice 31/62.0]
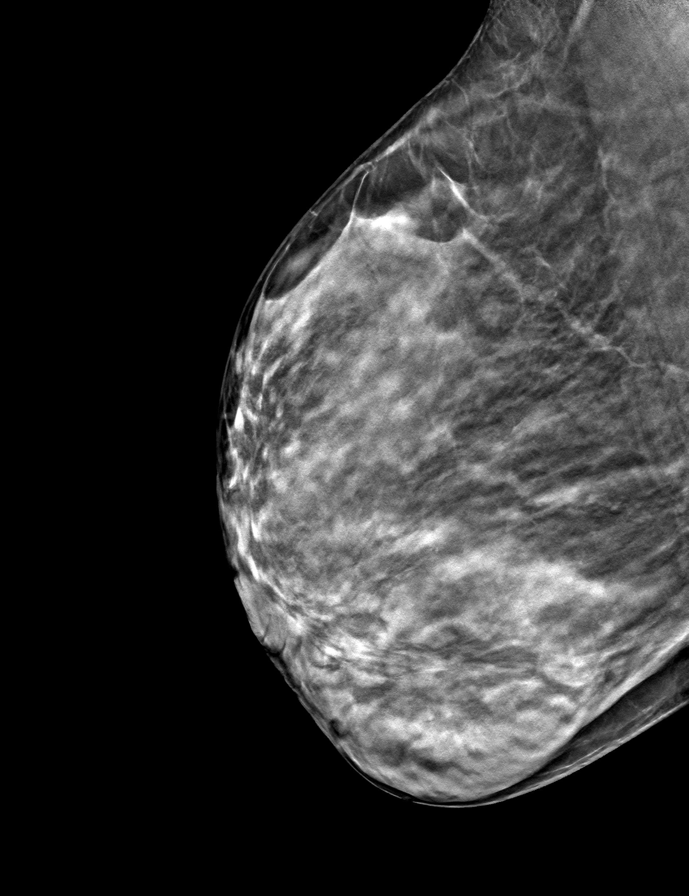

[R CC tomo · tomo slice 31/62.0]
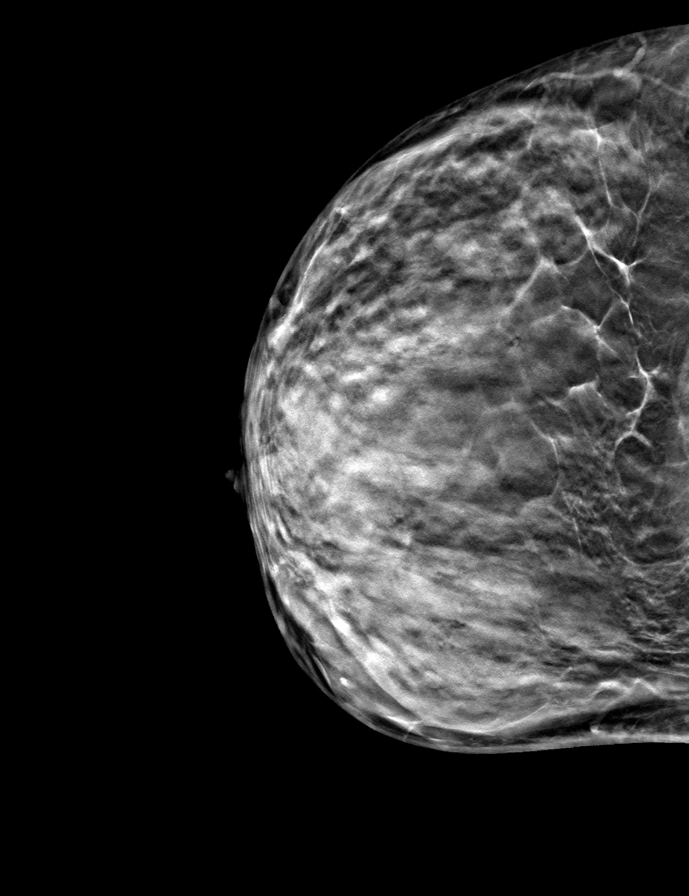

[L MLO tomo · tomo slice 35/70.0]
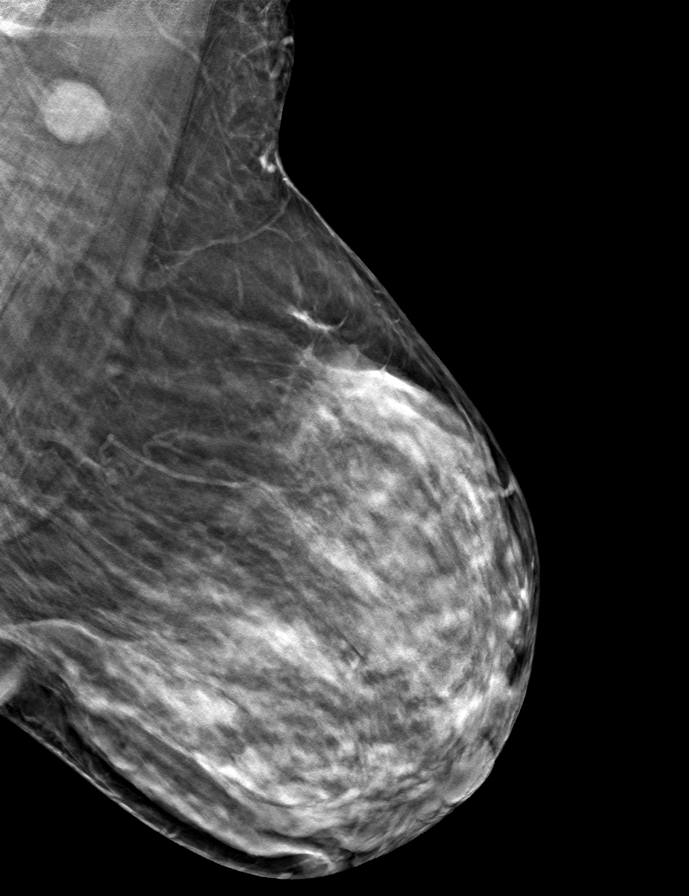

[9 of 24 positions shown; findings below may reference images not displayed]

ACR Breast Density Category c: The breast tissue is heterogeneously
dense, which may obscure small masses.
FINDINGS: In the left axilla, a possible mass/enlarged lymph node warrants
further evaluation. In the right and left breasts, no findings
suspicious for malignancy.
IMPRESSION: Further evaluation is suggested for possible mass in the left
axilla.

RECOMMENDATION:
Ultrasound of the left axilla. (Code:[id number]-[id number])

The patient will be contacted regarding the findings, and additional
imaging will be scheduled.

BI-RADS CATEGORY  0: Incomplete. Need additional imaging evaluation
and/or prior mammograms for comparison.

## 2023-01-28 ENCOUNTER — Ambulatory Visit (HOSPITAL_COMMUNITY)
Admission: EM | Admit: 2023-01-28 | Discharge: 2023-01-28 | Disposition: A | Discharge status: Home / Self Care | Payer: 59 | Attending: Nurse Practitioner | Admitting: Nurse Practitioner

## 2023-01-28 ENCOUNTER — Encounter (HOSPITAL_COMMUNITY): Payer: Self-pay

## 2023-01-28 ENCOUNTER — Telehealth: Payer: Self-pay | Admitting: *Deleted

## 2023-01-28 DIAGNOSIS — R21 Rash and other nonspecific skin eruption: Secondary | ICD-10-CM

## 2023-01-28 MED ORDER — PREDNISONE 10 MG (21) PO TBPK: ORAL_TABLET | ORAL | 0 refills | Status: DC

## 2023-01-28 MED ORDER — VALACYCLOVIR HCL 1 G PO TABS: 1000.0000 mg | ORAL_TABLET | Freq: Three times a day (TID) | ORAL | 0 refills | Status: AC

## 2023-01-28 NOTE — ED Triage Notes (Signed)
 Pt presents with c/o a rash all over her body.   Pt states it itches. Reports it is mainly on her back and chest.

## 2023-01-28 NOTE — Telephone Encounter (Signed)
 Call from patient states has a rash over her Chest, right arm and back.  Is aching all over.  No available appointments.  Advised to go to an Urgent Care for assessment of her symptoms.  Took Tylenol for a temperature of 99.5.  Will go to an Urgent Care.

## 2023-01-28 NOTE — Discharge Instructions (Signed)
 As we discussed, the rash is consistent with a possible viral rash called shingles.  Take the prednisone  and Valtrex  as prescribed to treat the rash.  Avoid any new detergents, soaps, or personal care products.  Seek care if symptoms worsen despite treatment.
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
## 2023-01-28 NOTE — Telephone Encounter (Signed)
Thank you Gladys!

## 2023-01-29 ENCOUNTER — Other Ambulatory Visit: Payer: Self-pay | Admitting: Nurse Practitioner

## 2023-01-29 DIAGNOSIS — Z1231 Encounter for screening mammogram for malignant neoplasm of breast: Secondary | ICD-10-CM

## 2023-01-30 ENCOUNTER — Ambulatory Visit
Admission: RE | Admit: 2023-01-30 | Discharge: 2023-01-30 | Discharge status: Home / Self Care | Payer: 59 | Source: Ambulatory Visit | Attending: Nurse Practitioner | Admitting: Nurse Practitioner

## 2023-01-30 DIAGNOSIS — Z1231 Encounter for screening mammogram for malignant neoplasm of breast: Secondary | ICD-10-CM

## 2023-02-01 ENCOUNTER — Other Ambulatory Visit: Payer: Self-pay | Admitting: Nurse Practitioner

## 2023-02-01 DIAGNOSIS — R928 Other abnormal and inconclusive findings on diagnostic imaging of breast: Secondary | ICD-10-CM

## 2023-02-11 ENCOUNTER — Encounter: Payer: 59 | Admitting: Student
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
## 2023-02-13 ENCOUNTER — Encounter: Payer: 59 | Admitting: Student

## 2023-02-18 ENCOUNTER — Other Ambulatory Visit: Payer: Self-pay | Admitting: Internal Medicine

## 2023-02-18 DIAGNOSIS — R928 Other abnormal and inconclusive findings on diagnostic imaging of breast: Secondary | ICD-10-CM

## 2023-02-19 ENCOUNTER — Other Ambulatory Visit: Payer: Self-pay | Admitting: Internal Medicine

## 2023-02-19 ENCOUNTER — Ambulatory Visit
Admission: RE | Admit: 2023-02-19 | Discharge: 2023-02-19 | Disposition: A | Discharge status: Home / Self Care | Payer: 59 | Source: Ambulatory Visit | Attending: Nurse Practitioner | Admitting: Nurse Practitioner

## 2023-02-19 DIAGNOSIS — R928 Other abnormal and inconclusive findings on diagnostic imaging of breast: Secondary | ICD-10-CM

## 2023-02-19 DIAGNOSIS — R599 Enlarged lymph nodes, unspecified: Secondary | ICD-10-CM

## 2023-02-26 ENCOUNTER — Ambulatory Visit
Admission: RE | Admit: 2023-02-26 | Discharge: 2023-02-26 | Disposition: A | Discharge status: Home / Self Care | Payer: 59 | Source: Ambulatory Visit | Attending: Internal Medicine | Admitting: Internal Medicine

## 2023-02-26 ENCOUNTER — Other Ambulatory Visit (HOSPITAL_COMMUNITY)
Admission: RE | Admit: 2023-02-26 | Discharge: 2023-02-26 | Disposition: A | Discharge status: Home / Self Care | Payer: 59 | Source: Ambulatory Visit | Attending: Body Imaging | Admitting: Body Imaging

## 2023-02-26 ENCOUNTER — Telehealth: Payer: Self-pay

## 2023-02-26 ENCOUNTER — Ambulatory Visit (INDEPENDENT_AMBULATORY_CARE_PROVIDER_SITE_OTHER): Payer: 59 | Admitting: Student

## 2023-02-26 VITALS — BP 119/79 | HR 89 | Temp 98.0°F | Ht 68.0 in | Wt 175.2 lb

## 2023-02-26 DIAGNOSIS — D500 Iron deficiency anemia secondary to blood loss (chronic): Secondary | ICD-10-CM | POA: Diagnosis not present

## 2023-02-26 DIAGNOSIS — G43109 Migraine with aura, not intractable, without status migrainosus: Secondary | ICD-10-CM

## 2023-02-26 DIAGNOSIS — R599 Enlarged lymph nodes, unspecified: Secondary | ICD-10-CM | POA: Insufficient documentation

## 2023-02-26 DIAGNOSIS — R928 Other abnormal and inconclusive findings on diagnostic imaging of breast: Secondary | ICD-10-CM

## 2023-02-26 MED ORDER — NURTEC 75 MG PO TBDP
75.0000 mg | ORAL_TABLET | Freq: Every day | ORAL | 1 refills | Status: DC | PRN
Start: 2023-02-26 — End: 2023-02-28
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
## 2023-02-26 NOTE — Assessment & Plan Note (Signed)
Patient continues to have about 1-2 migraines every 2-3 months.  She has previously failed sumatriptan and but has said that Advil worked however her migraines continue to last up to 4 days despite taking Advil.  We discussed new medication, Nurtec, with risk benefits and alternatives.  She is agreeable to try this medication. - Start Nurtec 75 mg daily as needed for migraines

## 2023-02-26 NOTE — Telephone Encounter (Signed)
Decision:Approved  Teresa Barnett (Key: [id number]) PA Case ID #: [id number] Rx #: [id number] Need Help? Call us at [phone] Outcome Approved today by [employer] 2017 CaseId:[id number];Status:Approved;Review Type:Prior Auth;Coverage Start Date:01/27/2023;Coverage End Date:02/26/2024; Authorization Expiration Date: 02/25/2024 Drug Nurtec 75MG  dispersible tablets ePA cloud logo Form [employer] Electronic PA Form [id number] NCPDP) Original Claim Info 75

## 2023-02-26 NOTE — Telephone Encounter (Signed)
Prior Authorization for patient (Nurtec 75MG  dispersible tablets) came through on cover my meds was submitted with last office notes awaiting approval or denial.  GLO:[id number]

## 2023-02-26 NOTE — Assessment & Plan Note (Addendum)
Patient has a history of iron deficiency anemia due to blood loss during heavy menstrual periods.  She has taken through the iron in the past but now takes it only while she is menstruating.  Her menstrual periods have not changed over the years.  She does not have any current symptoms consistent with anemia or iron deficiency.  We discussed updating labs for further management. - CBC and iron studies today

## 2023-02-26 NOTE — Patient Instructions (Signed)
  Thank you, Ms.Etha Ademidun Flammia, for allowing [place] to provide your care today. Today we discussed . . .  > Migraines       -Since you are still having migraines at last over 1 day despite using Advil and sumatriptan in the past we are going to try a new medication called Nurtec (rimegepant).  You can take this medication when you start to feel a migraine starting.  Do not take more than 1 dose of this medication within 24 hours.  The most common side effects with this medication are gastrointestinal upset and nausea.  If these are too bothersome please do not hesitate to let us know. > Iron deficiency       -We will recheck your blood count and your iron levels today and I will call you with results to let you know if you should resume taking iron daily, continue taking it with her menstrual periods, or if you do not need to take it at all.   I have ordered the following labs for you:   Lab Orders         CBC no Diff         Iron, TIBC and Ferritin Panel       I have ordered the following medication/changed the following medications:   Stop the following medications: There are no discontinued medications.   Start the following medications: Meds ordered this encounter  Medications   Rimegepant Sulfate (NURTEC) 75 MG TBDP    Sig: Take 1 tablet (75 mg total) by mouth daily as needed (Take for migraines, do not take more than one pill every 24 hours).    Dispense:  30 tablet    Refill:  1      Follow up: 6 months    Remember:     Should you have any questions or concerns please call the internal medicine clinic at [phone].     Rocky Morel, DO [facility]

## 2023-02-27 ENCOUNTER — Other Ambulatory Visit: Payer: Self-pay | Admitting: Student

## 2023-02-27 DIAGNOSIS — G43109 Migraine with aura, not intractable, without status migrainosus: Secondary | ICD-10-CM

## 2023-02-27 LAB — CBC
Hematocrit: 36.4 % (ref 34.0–46.6)
Hemoglobin: 11.1 g/dL (ref 11.1–15.9)
MCH: 25.6 pg — ABNORMAL LOW (ref 26.6–33.0)
MCHC: 30.5 g/dL — ABNORMAL LOW (ref 31.5–35.7)
MCV: 84 fL (ref 79–97)
Platelets: 214 10*3/uL (ref 150–450)
RBC: 4.34 x10E6/uL (ref 3.77–5.28)
RDW: 14.5 % (ref 11.7–15.4)
WBC: 4.4 10*3/uL (ref 3.4–10.8)

## 2023-02-27 LAB — IRON,TIBC AND FERRITIN PANEL
Ferritin: 45 ng/mL (ref 15–150)
Iron Saturation: 20 % (ref 15–55)
Iron: 54 ug/dL (ref 27–159)
Total Iron Binding Capacity: 276 ug/dL (ref 250–450)
UIBC: 222 ug/dL (ref 131–425)

## 2023-02-27 NOTE — Telephone Encounter (Signed)
Decision:Approved  Teresa Barnett (Key: [id number]) PA Case ID #: [id number] Rx #: [id number] Need Help? Call us at [phone] Outcome Approved today by [employer] 2017 CaseId:[id number];Status:Approved;Review Type:Prior Auth;Coverage Start Date:01/27/2023;Coverage End Date:02/26/2024; Authorization Expiration Date: 02/25/2024 Drug Nurtec 75MG  dispersible tablets ePA cloud logo Form [employer] Electronic PA Form [id number] NCPDP) Original Claim Info 75

## 2023-02-28 NOTE — Telephone Encounter (Signed)
Call to the pharmacy PA was completed for 15 only for 30 days.  Pharmacy to fill prescription for the 15 tablets only for now.

## 2023-02-28 NOTE — Progress Notes (Signed)
Internal Medicine Clinic Attending  Case discussed with the resident at the time of the visit.  We reviewed the resident's history and exam and pertinent patient test results.  I agree with the assessment, diagnosis, and plan of care documented in the resident's note.

## 2023-03-01 LAB — SURGICAL PATHOLOGY

## 2023-03-05 ENCOUNTER — Other Ambulatory Visit: Payer: Self-pay | Admitting: Internal Medicine

## 2023-03-05 DIAGNOSIS — R7611 Nonspecific reaction to tuberculin skin test without active tuberculosis: Secondary | ICD-10-CM

## 2023-03-05 NOTE — Progress Notes (Signed)
 Called to review right axillary lymph node core biopsy results from 2/11 with Teresa Barnett.  "Atypical lymphoid hyperplasia, favor reactive." Broad differential noted including infections and autoimmunity, lymphoproliferative disorders not favored on current biopsy. There was noted a single granuloma. 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 She has not had treatment for latent TB. Given this history and evidence of granuloma, will need to rule out TB. AFB stain added to biopsy, pending. I have asked pathology to add on multiplex TB PCR, waiting to hear back. Will get CXR as well. Discussed with ID, Dr. Ninetta Lights, who will see patient in outpatient clinic. Message sent to scheduling.

## 2023-03-13 NOTE — Progress Notes (Signed)
 Called and discussed results of CBC and iron panel with the patient.  She has been taking iron mainly during her menstrual periods and with her normal labs I give her the choice to continue this or if it is bothersome she can stop taking iron and we can recheck her iron levels at future date.

## 2023-03-14 ENCOUNTER — Telehealth: Payer: Self-pay | Admitting: Internal Medicine

## 2023-03-14 NOTE — Telephone Encounter (Signed)
 Patient was contacted at the request of Dr. Dickie La to schedule a  new patient appointment with Dr. Ninetta Lights to follow up on recent TB diagnosis.  Was able to touch base with patient and she agreed to come in for an appointment next Tuesday 03/19/23 at 3 pm.  Forwarding message to Dr. Sol Blazing to make her aware.

## 2023-03-19 ENCOUNTER — Ambulatory Visit (INDEPENDENT_AMBULATORY_CARE_PROVIDER_SITE_OTHER): Payer: 59 | Admitting: Infectious Diseases

## 2023-03-19 VITALS — BP 122/77 | HR 84 | Temp 98.3°F | Ht 68.0 in | Wt 178.1 lb

## 2023-03-19 DIAGNOSIS — Z227 Latent tuberculosis: Secondary | ICD-10-CM | POA: Diagnosis not present

## 2023-03-19 NOTE — Progress Notes (Signed)
   Subjective:    Patient ID: Teresa Barnett, female  DOB: 13-Feb-1980, 43 y.o.        MRN: [id number]   HPI 43 yo F with hx of ppd+ from [place] who came to [place] in 2006. She had screening test postive at that time. She had CXR.  Had repeat CXR 1 year ago which was (-).  She was found on mammogram to have R and L axillary masses which were confirmed on u/s. She underwent LN bx showing reactive features as well as a few non-caseating granulomas.  She states she had shingles prior to these tests.  Denies other pmhx.   No etoh, tobacco.  She denies exposure to TB (family or acquaintances). No hx of incarceration or homelessness.    Health Maintenance  Topic Date Due   COVID-19 Vaccine (4 - 2024-25 season) 09/16/2022   INFLUENZA VACCINE  04/15/2023 (Originally 08/16/2022)   Cervical Cancer Screening (HPV/Pap Cotest)  12/17/2023   MAMMOGRAM  01/30/2024   DTaP/Tdap/Td (2 - Td or Tdap) 04/13/2030   Hepatitis C Screening  Completed   HIV Screening  Completed   HPV VACCINES  Aged Out      Review of Systems  Constitutional:  Negative for chills, fever and weight loss.  Respiratory:  Negative for cough, sputum production and shortness of breath.   Cardiovascular:  Negative for chest pain.  Gastrointestinal:  Negative for constipation and diarrhea.  Genitourinary:  Negative for dysuria.    Please see HPI. All other systems reviewed and negative.     Objective:  Physical Exam Vitals reviewed.  Constitutional:      General: She is not in acute distress.    Appearance: Normal appearance. She is normal weight. She is not toxic-appearing.  HENT:     Mouth/Throat:     Mouth: Mucous membranes are moist.     Pharynx: No oropharyngeal exudate.  Eyes:     Extraocular Movements: Extraocular movements intact.     Pupils: Pupils are equal, round, and reactive to light.  Cardiovascular:     Rate and Rhythm: Normal rate and regular rhythm.  Pulmonary:     Effort: Pulmonary effort  is normal.     Breath sounds: Normal breath sounds.  Abdominal:     General: Bowel sounds are normal. There is no distension.     Palpations: Abdomen is soft.     Tenderness: There is no abdominal tenderness.  Musculoskeletal:        General: Normal range of motion.     Cervical back: Normal range of motion and neck supple.     Right lower leg: No edema.     Left lower leg: No edema.  Lymphadenopathy:     Head:     Right side of head: No submental adenopathy.     Left side of head: No submental adenopathy.     Cervical: No cervical adenopathy.     Upper Body:     Right upper body: No axillary adenopathy.     Left upper body: No axillary adenopathy.  Neurological:     General: No focal deficit present.     Mental Status: She is alert.  Psychiatric:        Mood and Affect: Mood normal.          Assessment & Plan:

## 2023-03-19 NOTE — Assessment & Plan Note (Addendum)
 She had CXR (-) and quantiferon+ at [facility] 03-2020.  She denies ever having been treated prior (with INH, rif, isoniazid).  We discussed treating her for LTBI vs active TB (given her granuloma on LN).  Her AFB stain on her LN bx was (-).   Will discuss with CDC. WIll f/u with [facility] to see if sample sent to [facility] for PCR primers. She is hesitant to be treated for TB.   Will see her back in 2 weeks.

## 2023-03-20 ENCOUNTER — Telehealth: Payer: Self-pay | Admitting: Infectious Diseases

## 2023-03-20 DIAGNOSIS — Z227 Latent tuberculosis: Secondary | ICD-10-CM

## 2023-03-20 NOTE — Telephone Encounter (Signed)
 Called pt and left VM- after speaking with ID colleagues, next course of action is to get repeat Biopsy for culture. Plan for 6 months RIPE after this.

## 2023-04-01 ENCOUNTER — Other Ambulatory Visit: Payer: Self-pay | Admitting: Surgery

## 2023-04-08 ENCOUNTER — Other Ambulatory Visit: Payer: Self-pay | Admitting: Infectious Diseases

## 2023-04-08 DIAGNOSIS — Z227 Latent tuberculosis: Secondary | ICD-10-CM

## 2023-04-10 ENCOUNTER — Telehealth: Payer: Self-pay

## 2023-04-10 ENCOUNTER — Telehealth: Payer: Self-pay | Admitting: *Deleted

## 2023-04-10 ENCOUNTER — Ambulatory Visit: Admitting: Infectious Diseases

## 2023-04-10 ENCOUNTER — Other Ambulatory Visit: Payer: Self-pay | Admitting: Infectious Diseases

## 2023-04-10 VITALS — BP 120/74 | HR 72 | Temp 98.3°F | Ht 68.0 in | Wt 177.7 lb

## 2023-04-10 DIAGNOSIS — M1711 Unilateral primary osteoarthritis, right knee: Secondary | ICD-10-CM | POA: Diagnosis not present

## 2023-04-10 DIAGNOSIS — Z227 Latent tuberculosis: Secondary | ICD-10-CM

## 2023-04-10 MED ORDER — LIDOCAINE 5 % EX PTCH: 1.0000 | MEDICATED_PATCH | Freq: Two times a day (BID) | CUTANEOUS | 3 refills | Status: AC

## 2023-04-10 NOTE — Telephone Encounter (Signed)
 Prior Authorization for patient (Lidocaine 5% patches) came through on cover my meds was submitted with last office notes awaiting approval or denial.  NFA:[id number]
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
## 2023-04-10 NOTE — Telephone Encounter (Signed)
 Teresa Barnett (Key: [id number]) PA Case ID #: [id number] Rx #: [id number] Need Help? Call us at [phone] Outcome Approved today by [employer] 2017 CaseId:[id number];Status:Approved;Review Type:Prior Auth;Coverage Start Date:03/11/2023;Coverage End Date:04/09/2024; Effective Date: 03/11/2023 Authorization Expiration Date: 04/09/2024 Drug Lidocaine 5% patches ePA cloud logo Form [employer] Electronic PA Form [phone] NCPDP) Original Claim Info 75 CALL HELP DESK

## 2023-04-10 NOTE — Telephone Encounter (Signed)
 Patient contacted with mammogram appointment April 2.2025 @ 7:30 am to arrive 7:10 am/ patient may call to reschedule due to the time of the appointment.

## 2023-04-10 NOTE — Assessment & Plan Note (Signed)
 Offered her that NSAID would be optimal She prefers to refill lidocaine patches.  Encouraged her to use ice prn.  Will see prn.

## 2023-04-10 NOTE — Assessment & Plan Note (Signed)
 Working on getting her u/s- IR guided bx for Cx for AFB.  She agrees to this.

## 2023-04-17 ENCOUNTER — Other Ambulatory Visit

## 2023-04-23 ENCOUNTER — Other Ambulatory Visit: Payer: Self-pay | Admitting: Radiology

## 2023-04-24 ENCOUNTER — Other Ambulatory Visit: Payer: Self-pay | Admitting: Radiology

## 2023-04-25 ENCOUNTER — Other Ambulatory Visit

## 2023-04-25 ENCOUNTER — Ambulatory Visit (HOSPITAL_COMMUNITY)
Admission: RE | Admit: 2023-04-25 | Discharge: 2023-04-25 | Disposition: A | Discharge status: Home / Self Care | Source: Ambulatory Visit | Attending: Infectious Diseases | Admitting: Infectious Diseases

## 2023-04-25 DIAGNOSIS — Z227 Latent tuberculosis: Secondary | ICD-10-CM | POA: Diagnosis not present

## 2023-04-25 DIAGNOSIS — R590 Localized enlarged lymph nodes: Secondary | ICD-10-CM | POA: Diagnosis present

## 2023-04-25 MED ORDER — LIDOCAINE HCL (PF) 1 % IJ SOLN
5.0000 mL | Freq: Once | INTRAMUSCULAR | Status: AC
  Administered 2023-04-25: 5 mL via INTRADERMAL

## 2023-04-25 NOTE — Procedures (Signed)
 Interventional Radiology Procedure Note  Procedure: US Guided Biopsy of right axillary lymph node  Complications: None  Estimated Blood Loss: < 10 mL  Findings: 18 G core biopsy of right axillary lymph node performed under US guidance.  Three core samples obtained and sent for culture studies including AFB.  Jodi Marble. Fredia Sorrow, M.D Pager:  [phone]

## 2023-04-27 LAB — ACID FAST SMEAR (AFB, MYCOBACTERIA): Acid Fast Smear: NEGATIVE

## 2023-04-30 LAB — AEROBIC/ANAEROBIC CULTURE W GRAM STAIN (SURGICAL/DEEP WOUND)
Culture: NO GROWTH
Gram Stain: NONE SEEN

## 2023-05-26 LAB — FUNGUS CULTURE WITH STAIN

## 2023-05-26 LAB — FUNGUS CULTURE RESULT

## 2023-05-26 LAB — FUNGAL ORGANISM REFLEX

## 2023-07-10 ENCOUNTER — Telehealth: Payer: Self-pay | Admitting: Infectious Diseases

## 2023-07-10 NOTE — Telephone Encounter (Signed)
 Hello Dr.Hatcher, Tammy with the [facility] called she stated she received a fax on the patient but she only received 1 page. Tammy stated she received a sheet with labs but all of the labs were not received. Would you like us  to fax her the lab results form 4/10?  Phone:[phone] [phone]

## 2023-07-10 NOTE — Telephone Encounter (Signed)
 Called [place] [facility] and asked them to treat her for active tb.  I am uncomfortable with her LAN, hx of positive TB screenings leaving her untreated.

## 2023-07-11 NOTE — Telephone Encounter (Signed)
 Last office visit and lab results has been faxed to Tammy @ [facility] department.

## 2023-07-16 ENCOUNTER — Telehealth: Payer: Self-pay | Admitting: *Deleted

## 2023-07-16 LAB — ACID FAST CULTURE WITH REFLEXED SENSITIVITIES (MYCOBACTERIA): Acid Fast Culture: NEGATIVE

## 2023-07-16 NOTE — Telephone Encounter (Signed)
 Call from [place], [place] [facility] called stating that patient is being treated for TB there.  Patient is asking questions about why she is being treated as well if she needs treatment for other tests that were positive for her.  Patient currently does not have an appointments schedule to see Dr. Eben.  Will message Dr. Eben to see if he will call patient or if patient needs to b scheduled for a follow up appointment in the [facility].

## 2023-07-18 ENCOUNTER — Telehealth: Payer: Self-pay | Admitting: Infectious Diseases

## 2023-07-18 NOTE — Telephone Encounter (Signed)
 Called and left VM and will call pt again.

## 2023-07-24 ENCOUNTER — Telehealth: Payer: Self-pay | Admitting: *Deleted

## 2023-07-24 NOTE — Telephone Encounter (Signed)
 Received a call from [place], [place] . Stated pt was referred to [facility] for active TB but f/u result of specimen stated culture negative for TB. Delon wants to know if there something else they need to do or treat or does pt needs to be treated for latent TB? Stated you can call her @ [phone]; she has secure vm if you need to leave a message. Sending to Dr Eben.

## 2023-08-20 ENCOUNTER — Ambulatory Visit: Admitting: Student

## 2023-08-20 VITALS — BP 129/81 | HR 92 | Temp 98.4°F | Ht 68.0 in | Wt 184.6 lb

## 2023-08-20 DIAGNOSIS — D500 Iron deficiency anemia secondary to blood loss (chronic): Secondary | ICD-10-CM | POA: Diagnosis not present

## 2023-08-20 DIAGNOSIS — Z227 Latent tuberculosis: Secondary | ICD-10-CM | POA: Diagnosis not present

## 2023-08-20 DIAGNOSIS — G43109 Migraine with aura, not intractable, without status migrainosus: Secondary | ICD-10-CM | POA: Diagnosis not present

## 2023-08-20 MED ORDER — NURTEC 75 MG PO TBDP: 1.0000 | ORAL_TABLET | Freq: Every day | ORAL | 3 refills | Status: AC | PRN

## 2023-08-20 NOTE — Assessment & Plan Note (Addendum)
 Migraine headaches occurring 1-2 times per month. Previously tried Nurtec with some relief. - Refilled Nurtec and sent to pharmacy.
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
## 2023-08-20 NOTE — Addendum Note (Signed)
 Addended by: ANTONE DWAYNE SAILOR on: 08/20/2023 01:39 PM   Modules accepted: Orders

## 2023-08-20 NOTE — Assessment & Plan Note (Signed)
 Endorses heavy menstrual cycle, unchanged from her baseline.  Hb 6 months ago was normal.  No pica symptoms. - Will check iron  panel - CBC

## 2023-08-20 NOTE — Patient Instructions (Addendum)
 It was a pleasure taking care of you today!    I have sent refills of Nurtec to the pharmacy.  2.  I will call you with the results of the labs.  3.  Please call the clinic if you have new coughs, night sweats.  4.  I strongly recommend getting treatment for latent TB.  I have ordered the following labs for you:   Lab Orders         CBC         Iron , TIBC and Ferritin Panel       Follow up: 6 months   Should you have any questions or concerns please call the internal medicine clinic at [phone].     Missy Sandhoff, MD  [facility]

## 2023-08-20 NOTE — Assessment & Plan Note (Signed)
 She has refused treatment for latent TB and continues to be adamant despite counseling. Denies cough or night sweats. Previously referred to the health department but has been avoiding follow-up calls. - Advised patient again to pursue treatment.

## 2023-08-20 NOTE — Addendum Note (Signed)
 Addended by: ANTONE DWAYNE SAILOR on: 08/20/2023 01:52 PM   Modules accepted: Orders

## 2023-08-21 LAB — CBC
Hematocrit: 38.5 % (ref 34.0–46.6)
Hemoglobin: 11.4 g/dL (ref 11.1–15.9)
MCH: 24.4 pg — ABNORMAL LOW (ref 26.6–33.0)
MCHC: 29.6 g/dL — ABNORMAL LOW (ref 31.5–35.7)
MCV: 82 fL (ref 79–97)
Platelets: 216 x10E3/uL (ref 150–450)
RBC: 4.68 x10E6/uL (ref 3.77–5.28)
RDW: 14.2 % (ref 11.7–15.4)
WBC: 6 x10E3/uL (ref 3.4–10.8)

## 2023-08-21 LAB — IRON,TIBC AND FERRITIN PANEL
Ferritin: 39 ng/mL (ref 15–150)
Iron Saturation: 12 % — ABNORMAL LOW (ref 15–55)
Iron: 39 ug/dL (ref 27–159)
Total Iron Binding Capacity: 338 ug/dL (ref 250–450)
UIBC: 299 ug/dL (ref 131–425)

## 2023-08-21 NOTE — Progress Notes (Signed)
 Internal Medicine Clinic Attending  Case discussed with the resident at the time of the visit.  We reviewed the resident's history and exam and pertinent patient test results.  I agree with the assessment, diagnosis, and plan of care documented in the resident's note.

## 2023-08-22 ENCOUNTER — Other Ambulatory Visit: Payer: Self-pay | Admitting: Student

## 2023-08-22 ENCOUNTER — Ambulatory Visit: Payer: Self-pay | Admitting: Student

## 2023-08-22 DIAGNOSIS — D500 Iron deficiency anemia secondary to blood loss (chronic): Secondary | ICD-10-CM

## 2023-08-22 MED ORDER — FERROUS SULFATE 325 (65 FE) MG PO TABS: 325.0000 mg | ORAL_TABLET | Freq: Every day | ORAL | 0 refills | Status: DC

## 2023-08-22 NOTE — Progress Notes (Signed)
 Normal CBC

## 2023-08-22 NOTE — Progress Notes (Signed)
 Ferritin <45 and low iron  saturation are concerning for iron  deficiency. Normal Hb. In her case, this is likely due to heavy menstrual blood loss. Recommend initiating daily iron  tablet supplementation, will check Iron  labs in 6-8 weeks.pt notified.

## 2023-08-26 ENCOUNTER — Encounter: Admitting: Student

## 2023-09-03 ENCOUNTER — Other Ambulatory Visit: Payer: Self-pay

## 2023-09-03 ENCOUNTER — Ambulatory Visit

## 2023-09-03 VITALS — BP 113/66 | HR 75 | Temp 98.2°F | Ht 68.0 in | Wt 183.8 lb

## 2023-09-03 DIAGNOSIS — M1711 Unilateral primary osteoarthritis, right knee: Secondary | ICD-10-CM

## 2023-09-03 DIAGNOSIS — M25561 Pain in right knee: Secondary | ICD-10-CM | POA: Diagnosis not present

## 2023-09-03 DIAGNOSIS — D500 Iron deficiency anemia secondary to blood loss (chronic): Secondary | ICD-10-CM | POA: Diagnosis not present

## 2023-09-03 MED ORDER — FERROUS SULFATE 325 (65 FE) MG PO TABS
325.0000 mg | ORAL_TABLET | Freq: Every day | ORAL | 3 refills | Status: AC
Start: 2023-09-03 — End: 2024-09-02

## 2023-09-03 NOTE — Assessment & Plan Note (Addendum)
 CC: pain in right knee. Pain started Saturday 8/16. No recent reported falls. History of arthritis of right knee. History of fall 4-5 years ago- hit medial part of knee and has had pain off/on.  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 What happened on Saturday I had never had that happen in my life patient did not turn or have any motion to precipitate pain. Put lidocaine  patch on + ice and pain subsided. Got a brace at [employer] on Sunday and has been wearing that. Described as dull and aching pain on medial aspect of knee, now also having some dull pain in calf.  In house walks up and down stairs but at work will just be walking a lot on flat surfaces. No new sports, no new exercising like bike riding or tennis.  Pain worse when going from sitting to standing, then slightly lesser pain when walking. NO PAIN at rest.  Physical Exam: Gait grossly normal, needs to push up on chair to get up. Right Knee - non tender to palpation, no signs of swelling or erythema. No obvious signs of trauma. Grossly nowmal when compared to left knee. ROM passive and active intact. Joint above and below assessed and benign.   Ddx: ITBS unlikely due to location being medial knee pain, PFPS unlikely due to acute nature and location of pain, PT unlikely due to pain description of dull/aching, gout and bursitis unlikely - non-tender to palpation, no swelling or erythema.   Plan:  -rest -tylenol /Advil   -lidocaine  patch PRN (1 for 12 hours in a 24 hour period) -continue using brace -If it doesn't get better within 4-6 weeks, consider Physical therapy.

## 2023-09-03 NOTE — Patient Instructions (Addendum)
 Today we discussed the following medical conditions and plan:   Right Knee Pain  Should get better on it's own with the following plan -rest -tylenol /Advil   -lidocaine  patch when you need it -continue using your brace If it doesn't get better within 4-6 weeks, we can refer to Physical therapy.   We look forward to seeing you next time. Please call our clinic at [phone] if you have any questions or concerns. The best time to call is Monday-Friday from 9am-4pm, but there is someone available 24/7. If you need medication refills, please notify your pharmacy one week in advance and they will send us  a request.   Thank you for trusting me with your care. Wishing you the best!   Sallyanne Primas, DO  [facility] Internal Medicine [facility]

## 2023-09-03 NOTE — Assessment & Plan Note (Addendum)
 Requested refill on daily iron  pill.  -iron  pill refilled to pharmacy
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
## 2023-09-03 NOTE — Progress Notes (Signed)
 Internal Medicine Clinic Attending  I was physically present during the key portions of the resident provided service and participated in the medical decision making of patient's management care. I reviewed pertinent patient test results.  The assessment, diagnosis, and plan were formulated together and I agree with the documentation in the resident's note.  Unclear etiology of medial knee pain - suspect likely pes anserine bursitis vs. OA, less likely medial meniscus tear given inability to elicit pain on exam. Unlikely to be crystalline arthropathy, occult fracture or infectious given absence of symptoms at rest, no effusion, no tenderness. Recommended OTC pain medications, rest and elevation. Offered PT if does not improve.   Jone Dauphin MD

## 2023-10-23 ENCOUNTER — Other Ambulatory Visit: Payer: Self-pay | Admitting: Nurse Practitioner

## 2023-10-23 DIAGNOSIS — Z1231 Encounter for screening mammogram for malignant neoplasm of breast: Secondary | ICD-10-CM

## 2023-12-30 ENCOUNTER — Telehealth: Payer: Self-pay | Admitting: Internal Medicine

## 2023-12-30 NOTE — Telephone Encounter (Unsigned)
 Copied from CRM #[id number]. Topic: Clinical - Refused Triage >> Dec 30, 2023  2:02 PM Alfonso ORN wrote: Patient/caller voiced complaints of patient. Declined transfer to triage. severe pain all over her body and pain and swelling in her knees Requesting an appointment today 12/30/23  ----------------------------------------------------------------------- From previous Reason for Contact - Medical Advice: Reason for CRM:    ----------------------------------------------------------------------- From previous Reason for Contact - Scheduling: Patient/patient representative is calling to schedule an appointment. Refer to attachments for appointment information.

## 2023-12-30 NOTE — Telephone Encounter (Signed)
 Call to patient stated she is having pain all over.  Level 8. States pain is cramping.  Mostly knee.  Would like to come in for an appointment.  Offered an appointment for tomorrow due to the time of the call.  Given appointment to come in on tomorrow.

## 2023-12-31 ENCOUNTER — Ambulatory Visit

## 2023-12-31 VITALS — BP 125/83 | HR 86 | Temp 98.3°F | Ht 68.0 in | Wt 181.4 lb

## 2023-12-31 DIAGNOSIS — M222X2 Patellofemoral disorders, left knee: Secondary | ICD-10-CM | POA: Diagnosis not present

## 2023-12-31 DIAGNOSIS — Z9181 History of falling: Secondary | ICD-10-CM

## 2023-12-31 DIAGNOSIS — Z0000 Encounter for general adult medical examination without abnormal findings: Secondary | ICD-10-CM

## 2023-12-31 DIAGNOSIS — Z791 Long term (current) use of non-steroidal anti-inflammatories (NSAID): Secondary | ICD-10-CM

## 2023-12-31 MED ORDER — DICLOFENAC SODIUM 1 % EX GEL: 2.0000 g | Freq: Four times a day (QID) | CUTANEOUS | 0 refills | Status: AC

## 2023-12-31 MED ORDER — MELOXICAM 7.5 MG PO TABS: 7.5000 mg | ORAL_TABLET | Freq: Every day | ORAL | 0 refills | Status: AC

## 2023-12-31 NOTE — Patient Instructions (Signed)
 Thank you, Ms. Teresa Barnett, for allowing us  to provide your care today. Today we discussed . . .  > Left knee pain       - We will send you to physical therapy for your knee pains.  I have also prescribed meloxicam  7.5 milligrams to take every day for 30 days to see if this is helpful for you.  You can also use Voltaren  gel which I will send to the pharmacy as well. > Pap smear       - Please schedule follow-up for your Pap smear   I have ordered the following labs for you:  Lab Orders  No laboratory test(s) ordered today      Referrals ordered today:    Referral Orders         Ambulatory referral to Physical Therapy       Follow up: For a Pap smear    Remember:  Should you have any questions or concerns please call the internal medicine clinic at [phone].     Melvenia Morrison, [facility]

## 2023-12-31 NOTE — Progress Notes (Signed)
 CC: Acute Concern of left knee pain  HPI:  Teresa Barnett is a 43 y.o. female with pertinent PMH of migraine with aura, latent TB infection, iron  deficiency anemia with heavy menstrual cycle and chronic knee pain who presents for acute concern of left knee pain. Please see problem based assessment and plan for further history.  ROS  Medications: Current Outpatient Medications  Medication Instructions   diclofenac  Sodium (VOLTAREN ) 2 g, Topical, 4 times daily   ferrous sulfate  325 mg, Oral, Daily   ketoconazole  (NIZORAL ) 2 % shampoo Apply to the back once daily and leave for five minutes before washing off. Apply this treatment for three days.   lidocaine  (LIDODERM ) 5 % 1 patch, Transdermal, Every 12 hours, Remove & Discard patch within 12 hours or as directed by MD   meloxicam  (MOBIC ) 7.5 mg, Oral, Daily   Nurtec 75 mg, Oral, Daily PRN     Physical Exam:  Vitals:   12/31/23 1049  BP: 125/83  Pulse: 86  Temp: 98.3 F (36.8 C)  TempSrc: Oral  SpO2: 98%  Weight: 181 lb 6.4 oz (82.3 kg)  Height: 5' 8 (1.727 m)    Physical Exam Constitutional:      General: She is not in acute distress.    Appearance: She is not ill-appearing.  Musculoskeletal:     Right knee: No swelling, effusion, erythema, ecchymosis or crepitus. Normal range of motion. Tenderness present over the medial joint line. No LCL laxity, MCL laxity, ACL laxity or PCL laxity.     Instability Tests: Anterior drawer test negative. Posterior drawer test negative. Medial McMurray test negative and lateral McMurray test negative.     Left knee: No swelling, effusion, erythema, ecchymosis or crepitus. Normal range of motion. Tenderness present over the medial joint line and patellar tendon. No LCL laxity, MCL laxity, ACL laxity or PCL laxity.    Instability Tests: Anterior drawer test negative. Posterior drawer test negative. Medial McMurray test negative and lateral McMurray test negative.     Comments:  Tender over the medial joint line and patella of the left knee. Mildly decreased quadriceps strength bilaterally as evidenced by inability to perform deep squat.  Otherwise 5 out of 5 strength throughout bilateral lower extremities  Neurological:     Mental Status: She is alert.       Assessment & Plan:   Assessment & Plan Patellofemoral disorder, left Has had chronic pain of her left knee since a fall over a year and a half ago. She had imaging at that time which demonstrated no acute fractures or dislocations.  There is also no arthritis at that time.  MRI in February 2024 showed a small dorsal defect of the patella with mild overlying chondrosis but no focal chondral defect.  There is also no meniscal tear or injury.  She has not had any falls since.  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  However yesterday her pain significantly worsened to the point that she needed to leave work.  In the past she has just tried to manage her pain with Tylenol .  Her pain is exacerbated by going up stairs.  She has never tried physical therapy.  Her pain today is a 6 out of 10, decreased from 8 out of 10 yesterday. She also has pain in her right knee but this is not as problematic for her today. She also  had generalized pain yesterday which is now resolved with Tylenol .  She has no fevers or chills.  On physical exam, unable to perform a deep squat secondary to pain.  She has full range of motion of her bilateral lower extremities.  There is no erythema, effusion, or warmth of her bilateral knees.  She is tender over her medial joint line as well as her patella.  Her primarily anterior knee pain, mild quadriceps weakness, and tenderness both medially and over the patella suggest that this may be due to a patellofemoral pain syndrome.  I do not think we need to repeat x-rays at this time as I think they are unlikely to change  management for this patient.  Regardless of the etiology of her symptoms, I think she will benefit from physical therapy as she does have chronic pain in her knees.  -Referral to physical therapy - Meloxicam  7.5 mg daily for 30 days - Voltaren  gel as needed - Follow-up in 6 weeks if pain not improved, otherwise follow-up for Pap smear as available Healthcare maintenance Patient due for Pap smear today, patient elected to have this done at a later date time constraints today.  Will have her schedule a Pap smear in the next couple of weeks.  Orders Placed This Encounter  Procedures   Ambulatory referral to Physical Therapy    Referral Priority:   Routine    Referral Type:   Physical Medicine    Referral Reason:   Specialty Services Required    Requested Specialty:   Physical Therapy    Number of Visits Requested:   12    Patient seen with Dr. Layman Jeanelle Melvenia Napoleon, MD [facility] Internal [facility] Resident PGY-1 Clinic Phone: [phone] Please contact the on call pager at [phone] for any urgent or emergent needs.

## 2023-12-31 NOTE — Assessment & Plan Note (Signed)
 Patient due for Pap smear today, patient elected to have this done at a later date time constraints today.  Will have her schedule a Pap smear in the next couple of weeks.

## 2023-12-31 NOTE — Assessment & Plan Note (Signed)
 Has had chronic pain of her left knee since a fall over a year and a half ago. She had imaging at that time which demonstrated no acute fractures or dislocations.  There is also no arthritis at that time.  MRI in February 2024 showed a small dorsal defect of the patella with mild overlying chondrosis but no focal chondral defect.  There is also no meniscal tear or injury.  She has not had any falls since.  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  However yesterday her pain significantly worsened to the point that she needed to leave work.  In the past she has just tried to manage her pain with Tylenol .  Her pain is exacerbated by going up stairs.  She has never tried physical therapy.  Her pain today is a 6 out of 10, decreased from 8 out of 10 yesterday. She also has pain in her right knee but this is not as problematic for her today. She also had generalized pain yesterday which is now resolved with Tylenol .  She has no fevers or chills.  On physical exam, unable to perform a deep squat secondary to pain.  She has full range of motion of her bilateral lower extremities.  There is no erythema, effusion, or warmth of her bilateral knees.  She is tender over her medial joint line as well as her patella.  Her primarily anterior knee pain, mild quadriceps weakness, and tenderness both medially and over the patella suggest that this may be due to a patellofemoral pain syndrome.  I do not think we need to repeat x-rays at this time as I think they are unlikely to change management for this patient.  Regardless of the etiology of her symptoms, I think she will benefit from physical therapy as she does have chronic pain in her knees.  -Referral to physical therapy - Meloxicam  7.5 mg daily for 30 days - Voltaren  gel as needed - Follow-up in 6 weeks if pain not improved, otherwise follow-up for Pap smear as available

## 2024-01-02 NOTE — Progress Notes (Signed)
 [facility] Attending  I was physically present during the key portions of the resident provided service and participated in the medical decision making of patient's management care. I reviewed pertinent patient test results.  The assessment, diagnosis, and plan were formulated together and I agree with the documentation in the resident's note.  Jeanelle Layman CROME, MD

## 2024-01-14 ENCOUNTER — Ambulatory Visit: Payer: Self-pay | Admitting: Student

## 2024-01-14 NOTE — Progress Notes (Deleted)
*** ° ° ° °  CC: Routine Follow Up for cervical cancer screening with Pap smear after last office visit 12/31/2023  HPI:  Teresa Barnett is a 43 y.o. female with pertinent PMH of migraine with aura, latent TB, iron  deficiency anemia 2/2 abnormal uterine bleeding, and chronic knee pain 2/2 patellofemoral syndrome who presents as above. Please see assessment and plan below for further details.  Medications: Current Outpatient Medications  Medication Instructions   diclofenac  Sodium (VOLTAREN ) 2 g, Topical, 4 times daily   ferrous sulfate  325 mg, Oral, Daily   ketoconazole  (NIZORAL ) 2 % shampoo Apply to the back once daily and leave for five minutes before washing off. Apply this treatment for three days.   lidocaine  (LIDODERM ) 5 % 1 patch, Transdermal, Every 12 hours, Remove & Discard patch within 12 hours or as directed by MD   meloxicam  (MOBIC ) 7.5 mg, Oral, Daily   Nurtec 75 mg, Oral, Daily PRN     Review of Systems:   Pertinent items noted in HPI and/or A&P.  Physical Exam:  There were no vitals filed for this visit.  Constitutional:***. In no acute distress. HEENT: Normocephalic, atraumatic, Sclera non-icteric, PERRL, EOM intact Cardio:Regular rate and rhythm. 2+ bilateral {PulseLoc:28294} pulses. Pulm:Clear to auscultation bilaterally. Normal work of breathing on room air. Abdomen: Soft, non-tender, non-distended, positive bowel sounds. FDX:Wzhjupcz for extremity edema. Skin:Warm and dry. Neuro:Alert and oriented x3. No focal deficit noted. Psych:Pleasant mood and affect.   Assessment & Plan:   Assessment & Plan   No orders of the defined types were placed in this encounter.    No follow-ups on file.   Patient {GC/GE:[id number]::discussed with,seen with} {JGIMTSattending2025/2026:32954}  Fairy Pool, DO [facility] Internal [facility] Resident PGY-3 Clinic Phone: [phone] Please contact the on call pager at [phone] for any urgent  or emergent needs.

## 2024-01-28 ENCOUNTER — Telehealth: Payer: Self-pay

## 2024-01-28 NOTE — Telephone Encounter (Signed)
 [name] (Key: [id number]) PA Case ID #: [id number] Need Help? Call us  at [phone] Outcome Approved today by [employer] 2017 CaseId:[id number];Status:Approved;Review Type:Prior Auth;Coverage Start Date:12/29/2023;Coverage End Date:01/27/2025; Effective Date: 12/29/2023 Authorization Expiration Date: 01/27/2025 Drug Nurtec 75MG  dispersible tablets ePA cloud logo Form [employer] Electronic PA Form [phone] NCPDP)

## 2024-01-28 NOTE — Telephone Encounter (Signed)
 Prior Authorization for patient (Nurtec 75MG  dispersible tablets) came through on cover my meds was submitted with last office notes awaiting approval or denial.  [place]

## 2024-02-04 ENCOUNTER — Ambulatory Visit
Admission: RE | Admit: 2024-02-04 | Discharge: 2024-02-04 | Disposition: A | Source: Ambulatory Visit | Attending: Nurse Practitioner | Admitting: Nurse Practitioner

## 2024-02-04 DIAGNOSIS — Z1231 Encounter for screening mammogram for malignant neoplasm of breast: Secondary | ICD-10-CM
